# Patient Record
Sex: Female | Born: 1980
Health system: Southern US, Community
[De-identification: ages and names within clinical notes are randomized; demographics above are authoritative.]

## PROBLEM LIST (undated history)

## (undated) DIAGNOSIS — J301 Allergic rhinitis due to pollen: Secondary | ICD-10-CM

## (undated) DIAGNOSIS — I10 Essential (primary) hypertension: Secondary | ICD-10-CM

## (undated) DIAGNOSIS — N979 Female infertility, unspecified: Secondary | ICD-10-CM

## (undated) HISTORY — DX: Female infertility, unspecified: N97.9

## (undated) HISTORY — DX: Allergic rhinitis due to pollen: J30.1

## (undated) HISTORY — DX: Essential (primary) hypertension: I10

---

## 2002-02-02 ENCOUNTER — Encounter: Payer: Self-pay | Admitting: *Deleted

## 2002-02-02 ENCOUNTER — Inpatient Hospital Stay (HOSPITAL_COMMUNITY): Admission: AD | Admit: 2002-02-02 | Discharge: 2002-02-26 | Payer: Self-pay | Admitting: *Deleted

## 2002-02-08 ENCOUNTER — Encounter: Payer: Self-pay | Admitting: Family Medicine

## 2002-02-12 ENCOUNTER — Encounter: Payer: Self-pay | Admitting: *Deleted

## 2002-02-13 ENCOUNTER — Encounter: Payer: Self-pay | Admitting: *Deleted

## 2002-02-14 ENCOUNTER — Encounter: Payer: Self-pay | Admitting: Obstetrics and Gynecology

## 2002-02-16 ENCOUNTER — Encounter: Payer: Self-pay | Admitting: *Deleted

## 2002-02-17 ENCOUNTER — Encounter: Payer: Self-pay | Admitting: *Deleted

## 2002-02-18 ENCOUNTER — Encounter: Payer: Self-pay | Admitting: *Deleted

## 2002-02-19 ENCOUNTER — Encounter: Payer: Self-pay | Admitting: *Deleted

## 2002-02-27 ENCOUNTER — Inpatient Hospital Stay (HOSPITAL_COMMUNITY): Admission: AD | Admit: 2002-02-27 | Discharge: 2002-02-27 | Payer: Self-pay | Admitting: *Deleted

## 2004-05-14 ENCOUNTER — Ambulatory Visit: Payer: Self-pay

## 2011-06-30 ENCOUNTER — Emergency Department: Payer: Self-pay | Admitting: Emergency Medicine

## 2011-07-01 LAB — PREGNANCY, URINE: Pregnancy Test, Urine: NEGATIVE m[IU]/mL

## 2012-07-15 ENCOUNTER — Ambulatory Visit: Payer: Self-pay | Admitting: Internal Medicine

## 2013-03-11 DIAGNOSIS — N979 Female infertility, unspecified: Secondary | ICD-10-CM

## 2013-03-11 HISTORY — DX: Female infertility, unspecified: N97.9

## 2013-03-26 ENCOUNTER — Ambulatory Visit: Payer: Self-pay | Admitting: Obstetrics and Gynecology

## 2013-08-27 ENCOUNTER — Ambulatory Visit (INDEPENDENT_AMBULATORY_CARE_PROVIDER_SITE_OTHER): Payer: 59 | Admitting: Gynecology

## 2013-08-27 ENCOUNTER — Encounter: Payer: Self-pay | Admitting: Gynecology

## 2013-08-27 VITALS — BP 134/90 | Ht 62.0 in | Wt 227.0 lb

## 2013-08-27 DIAGNOSIS — N979 Female infertility, unspecified: Secondary | ICD-10-CM

## 2013-08-27 NOTE — Patient Instructions (Signed)
Semen Analysis This is a test used to learn about the health of your reproductive organs, particularly if your partner is having trouble becoming pregnant, or after a vasectomy to determine if the operation was successful. Semen is the turbid, whitish substance that is released from the penis during ejaculation. Sperm are the cells in semen with a head and a tail that enables them to travel to the egg. A sperm contains one copy of each chromosome (all of the female's genes) and fuses with the female's egg, resulting in fertilization.  PREPARATION FOR TEST A semen sample will be collected in a sterile, wide-mouth container provided by the lab. NORMAL FINDINGS  Volume: 2-5 mL  Liquefaction time: 20 to 30 minutes after collection  pH: 7.12-8.00  Sperm count (density): 50-200 million/mL  Sperm motility: 60% to 80% actively motile  Sperm morphology: 70% to 90% normally shaped Ranges for normal findings may vary among different laboratories and hospitals. You should always check with your doctor after having lab work or other tests done to discuss the meaning of your test results and whether your values are considered within normal limits. MEANING OF TEST  Your caregiver will go over the test results with you and discuss the importance and meaning of your results, as well as treatment options and the need for additional tests if necessary. OBTAINING THE TEST RESULTS It is your responsibility to obtain your test results. Ask the lab or department performing the test when and how you will get your results. Document Released: 03/22/2004 Document Revised: 05/20/2011 Document Reviewed: 02/07/2008 [facility] Patient Information 2015 [place], [place]. This information is not intended to replace advice given to you by your health care provider. Make sure you discuss any questions you have with your health care provider.

## 2013-08-27 NOTE — Progress Notes (Signed)
   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 This is patient's second marriage. She has never had children with her current partner. She was being followed by another gynecologist in [facility]. She had an HSG in January 2015 which was reported to be normal. Patient also has had normal ultrasound as well. She is reporting normal 28 day menstrual cycles. She is slightly overweight. Her husband has been adamant on getting a semen analysis. Her first pregnancy was by prior marriage spontaneous but delivered at 62 weeks secondary to preeclampsia. Patient many years ago was treated for an STD. Patient's recent annual exam and Pap smear in [facility] was poor to be normal.  We had a discussion of the importance of obtaining a semen analysis from her husband before moving forward. She has not been using the ovulation predictor kit that time or intercourse. She does state that her husband has very small testicles and has at times issues with direction and very little ejaculatory. She states that he has never had any issues such as trauma to the testicles are any developmental issues in his history. She does smoke a pack cigarette per day and is taking no other medications.  I have explained to the patient before we can move forward that he needs to see the urologist so that they may proceed with doing a semen analysis and further evaluation since she appears to be ovulating and has tubal patency. Meanwhile she was given instructions on the utilization of ovulation predictor kit that time or intercourse as well as the importance of prenatal vitamins for neural tube defect prophylaxis.  The patient did mention that this year her Pap smear was normal but HPV changes were noted and she will need a followup Pap smear in one year.

## 2014-01-10 ENCOUNTER — Encounter: Payer: Self-pay | Admitting: Gynecology

## 2014-07-05 ENCOUNTER — Ambulatory Visit
Admit: 2014-07-05 | Disposition: A | Discharge status: Home / Self Care | Payer: Self-pay | Attending: Family Medicine | Admitting: Family Medicine

## 2014-08-25 DIAGNOSIS — R0789 Other chest pain: Secondary | ICD-10-CM | POA: Insufficient documentation

## 2016-07-24 ENCOUNTER — Encounter: Payer: Self-pay | Admitting: Gynecology

## 2017-04-27 ENCOUNTER — Encounter: Payer: Self-pay | Admitting: Emergency Medicine

## 2017-04-27 ENCOUNTER — Other Ambulatory Visit: Payer: Self-pay

## 2017-04-27 ENCOUNTER — Emergency Department: Payer: 59

## 2017-04-27 ENCOUNTER — Emergency Department
Admission: EM | Admit: 2017-04-27 | Discharge: 2017-04-27 | Disposition: A | Discharge status: Home / Self Care | Payer: 59 | Attending: Emergency Medicine | Admitting: Emergency Medicine

## 2017-04-27 DIAGNOSIS — R102 Pelvic and perineal pain: Secondary | ICD-10-CM

## 2017-04-27 DIAGNOSIS — D219 Benign neoplasm of connective and other soft tissue, unspecified: Secondary | ICD-10-CM

## 2017-04-27 DIAGNOSIS — R319 Hematuria, unspecified: Secondary | ICD-10-CM

## 2017-04-27 DIAGNOSIS — D250 Submucous leiomyoma of uterus: Secondary | ICD-10-CM | POA: Diagnosis not present

## 2017-04-27 DIAGNOSIS — D529 Folate deficiency anemia, unspecified: Secondary | ICD-10-CM | POA: Diagnosis not present

## 2017-04-27 LAB — URINALYSIS, COMPLETE (UACMP) WITH MICROSCOPIC
Bacteria, UA: NONE SEEN
Bilirubin Urine: NEGATIVE
GLUCOSE, UA: NEGATIVE mg/dL
Ketones, ur: NEGATIVE mg/dL
LEUKOCYTES UA: NEGATIVE
NITRITE: NEGATIVE
PROTEIN: 30 mg/dL — AB
Specific Gravity, Urine: 1.027 (ref 1.005–1.030)
pH: 5 (ref 5.0–8.0)

## 2017-04-27 LAB — WET PREP, GENITAL
CLUE CELLS WET PREP: NONE SEEN
SPERM: NONE SEEN
TRICH WET PREP: NONE SEEN
Yeast Wet Prep HPF POC: NONE SEEN

## 2017-04-27 LAB — CHLAMYDIA/NGC RT PCR (ARMC ONLY)
Chlamydia Tr: NOT DETECTED
N gonorrhoeae: NOT DETECTED

## 2017-04-27 LAB — PREGNANCY, URINE: Preg Test, Ur: NEGATIVE

## 2017-04-27 LAB — POCT PREGNANCY, URINE: Preg Test, Ur: NEGATIVE

## 2017-04-27 NOTE — Discharge Instructions (Addendum)
You are evaluated for left lower abdominal pain and also found to have some blood in the urine, uncertain etiology, although no certain cause was found, your exam and evaluation are overall reassuring in the emergency room today.  Return to emerge department immediately for any worsening or uncontrolled pain, black or bloody stool, vomiting, or any other symptoms concerning to you.

## 2017-04-27 NOTE — ED Notes (Signed)
Pt verbalizes d/c understanding and follow up. Pt in NAD at time of departure, VS stable. Pt ambulatory to lobby

## 2017-04-27 NOTE — ED Triage Notes (Signed)
Pt arrives ambulatory to triage with c/o of lower left pelvic pain. Pt reports hx of ovarian cyst and reports that this feels like the same. Pt is in NAD at this time.

## 2017-04-27 NOTE — ED Provider Notes (Signed)
[facility] Emergency Department Provider Note ____________________________________________   I have reviewed the triage vital signs and the triage nursing note.  HISTORY  Chief Complaint Pelvic Pain   Historian Patient  HPI Sherri Blanchard is a 37 y.o. female presents with left lower quadrant pain since Thursday.  It feels like prior ovarian cyst.  She states that she is admitted cycle right now.  She is not on her period.  Denies urinary symptoms.  States that she has occasional diarrhea, does not deal with constipation.  No fevers.  No nausea or vomiting.  Symptoms are moderate, nothing makes it worse or better.   Past Medical History:  Diagnosis Date  . Hypertension     Patient Active Problem List   Diagnosis Date Noted  . Infertility, female, secondary 08/27/2013    Past Surgical History:  Procedure Laterality Date  . CESAREAN SECTION  2003    Prior to Admission medications   Medication Sig Start Date End Date Taking? Authorizing Provider  cetirizine (ZYRTEC) 10 MG tablet Take 10 mg by mouth daily. 02/22/17  Yes [provider]  hydrochlorothiazide (HYDRODIURIL) 25 MG tablet Take 25 mg by mouth daily.    Yes [provider]  Multiple Vitamin (MULTIVITAMIN) tablet Take 1 tablet by mouth daily.   Yes [provider]    Allergies  Allergen Reactions  . Amoxicillin-Pot Clavulanate Other (See Comments)    Yeast infection     Family History  Problem Relation Age of Onset  . Hypertension Mother   . Diabetes Father   . Hypertension Father   . Cancer Father        PROSTATE  . Breast cancer Maternal Grandmother   . Cancer Maternal Grandmother        BONE  . Cancer Maternal Grandfather        LUNG  . Diabetes Paternal Grandmother     Social History Social History   Tobacco Use  . Smoking status: Current Some Day Smoker    Packs/day: 0.10  . Smokeless tobacco: Never Used  Substance Use Topics  . Alcohol  use: Yes    Comment: OCC  . Drug use: No    Review of Systems  Constitutional: Negative for fever. Eyes: Negative for visual changes. ENT: Negative for sore throat. Cardiovascular: Negative for chest pain. Respiratory: Negative for shortness of breath. Gastrointestinal: Negative for vomiting. Genitourinary: Negative for dysuria. Musculoskeletal: Negative for back pain. Skin: Negative for rash. Neurological: Negative for headache.  ____________________________________________   PHYSICAL EXAM:  VITAL SIGNS: ED Triage Vitals  Enc Vitals Group     BP 04/27/17 0217 (!) 151/92     Pulse Rate 04/27/17 0217 (!) 102     Resp 04/27/17 0217 16     Temp 04/27/17 0217 (!) 97.5 F (36.4 C)     Temp Source 04/27/17 0217 Oral     SpO2 04/27/17 0217 98 %     Weight 04/27/17 0222 240 lb (108.9 kg)     Height 04/27/17 0222 5\' 1"  (1.549 m)     Head Circumference --      Peak Flow --      Pain Score 04/27/17 0221 5     Pain Loc --      Pain Edu? --      Excl. in [place]? --      Constitutional: Alert and oriented. Well appearing and in no distress. HEENT   Head: Normocephalic and atraumatic.      Eyes: Conjunctivae  are normal. Pupils equal and round.       Ears:         Nose: No congestion/rhinnorhea.   Mouth/Throat: Mucous membranes are moist.   Neck: No stridor. Cardiovascular/Chest: Normal rate, regular rhythm.  No murmurs, rubs, or gallops. Respiratory: Normal respiratory effort without tachypnea nor retractions. Breath sounds are clear and equal bilaterally. No wheezes/rales/rhonchi. Gastrointestinal: Soft. No distention, no guarding, no rebound.  Beats.  Mild tenderness left lower quadrant. Genitourinary/rectal: Scant normal-appearing discharge without any cervicitis or adnexal tenderness or mass. Musculoskeletal: Nontender with normal range of motion in all extremities. No joint effusions.  No lower extremity tenderness.  No edema. Neurologic:  Normal speech and  language. No gross or focal neurologic deficits are appreciated. Skin:  Skin is warm, dry and intact. No rash noted. Psychiatric: Mood and affect are normal. Speech and behavior are normal. Patient exhibits appropriate insight and judgment.   ____________________________________________  LABS (pertinent positives/negatives) I, Lisa Roca, MD the attending physician have reviewed the labs noted below.  Labs Reviewed  WET PREP, GENITAL - Abnormal; Notable for the following components:      Result Value   WBC, Wet Prep HPF POC RARE (*)    All other components within normal limits  URINALYSIS, COMPLETE (UACMP) WITH MICROSCOPIC - Abnormal; Notable for the following components:   Color, Urine YELLOW (*)    APPearance HAZY (*)    Hgb urine dipstick MODERATE (*)    Protein, ur 30 (*)    Squamous Epithelial / LPF 0-5 (*)    All other components within normal limits  CHLAMYDIA/NGC RT PCR ([facility] ONLY)  PREGNANCY, URINE  POC URINE PREG, ED  POCT PREGNANCY, URINE    ____________________________________________  RADIOLOGY All Xrays were viewed by me.  Imaging interpreted by Radiologist, and I, Lisa Roca, MD the attending physician have reviewed the radiologist interpretation noted below.  Pelvic ultrasound and transvaginal:   IMPRESSION: 1. Normal blood flow to both ovaries. 2. Sub mucosal fibroid at the posterior uterine fundus measuring up to 1.1 cm. 3. Thickened and mildly vascular endometrium. Correlate with phase of menstrual cycle. This thickness would be normal for late proliferative or secretory phases.  CT abd without contrast:  IMPRESSION: No acute findings in the abdomen/pelvis. __________________________________________  PROCEDURES  Procedure(s) performed: None  Critical Care performed: None   ____________________________________________  ED COURSE / ASSESSMENT AND PLAN  Pertinent labs & imaging results that were available during my care of the  patient were reviewed by me and considered in my medical decision making (see chart for details).   No ovarian cyst or torsion on ultrasound.  Adding on pregnancy test.  Patient has hematuria without urinary symptoms.  We discussed possibility of kidney stone, chose to proceed with CT imaging given persistent moderate to severe pain without alternative cause at this point time.  We discussed risk and benefit with regard to radiation exposure and chose to proceed.  She herself is a little concerned about the diagnosis of diverticulitis, however she has not had a fever or bloody stool and she has normal white blood cell count.  Wet prep and gonorrhea and chlamydia are all reassuring.  CT scan shows no acute findings.  Discussed with patient, okay for discharge.  Recommend primary care follow-up.  DIFFERENTIAL DIAGNOSIS: Differential diagnosis includes, but is not limited to, ovarian cyst, ovarian torsion, acute appendicitis, diverticulitis, urinary tract infection/pyelonephritis, endometriosis, bowel obstruction, colitis, renal colic, gastroenteritis, hernia, fibroids, endometriosis, pregnancy related pain including ectopic pregnancy, etc.  CONSULTATIONS:   None   Patient / Family / Caregiver informed of clinical course, medical decision-making process, and agree with plan.   I discussed return precautions, follow-up instructions, and discharge instructions with patient and/or family.  Discharge Instructions : You are evaluated for left lower abdominal pain and also found to have some blood in the urine, uncertain etiology, although no certain cause was found, your exam and evaluation are overall reassuring in the emergency room today.  Return to emerge department immediately for any worsening or uncontrolled pain, black or bloody stool, vomiting, or any other symptoms concerning to you.    ___________________________________________   FINAL CLINICAL IMPRESSION(S) / ED  DIAGNOSES   Final diagnoses:  Pelvic pain in female  Hematuria of undiagnosed cause  Fibroid      ___________________________________________        Note: This dictation was prepared with Dragon dictation. Any transcriptional errors that result from this process are unintentional    Lisa Roca, MD 04/27/17 1222

## 2017-04-27 NOTE — ED Notes (Signed)
Pt to US via wheelchair.

## 2017-05-08 DIAGNOSIS — R102 Pelvic and perineal pain: Secondary | ICD-10-CM | POA: Diagnosis not present

## 2017-06-17 DIAGNOSIS — Z3200 Encounter for pregnancy test, result unknown: Secondary | ICD-10-CM | POA: Diagnosis not present

## 2017-07-28 DIAGNOSIS — M2142 Flat foot [pes planus] (acquired), left foot: Secondary | ICD-10-CM | POA: Diagnosis not present

## 2017-07-28 DIAGNOSIS — M79672 Pain in left foot: Secondary | ICD-10-CM | POA: Diagnosis not present

## 2017-07-28 DIAGNOSIS — Q666 Other congenital valgus deformities of feet: Secondary | ICD-10-CM | POA: Diagnosis not present

## 2017-07-28 DIAGNOSIS — M2141 Flat foot [pes planus] (acquired), right foot: Secondary | ICD-10-CM | POA: Diagnosis not present

## 2017-07-28 DIAGNOSIS — M2011 Hallux valgus (acquired), right foot: Secondary | ICD-10-CM | POA: Diagnosis not present

## 2017-07-28 DIAGNOSIS — M21619 Bunion of unspecified foot: Secondary | ICD-10-CM | POA: Diagnosis not present

## 2017-07-28 DIAGNOSIS — M79671 Pain in right foot: Secondary | ICD-10-CM | POA: Diagnosis not present

## 2017-09-30 ENCOUNTER — Encounter: Payer: Self-pay | Admitting: Obstetrics and Gynecology

## 2017-09-30 ENCOUNTER — Other Ambulatory Visit (HOSPITAL_COMMUNITY)
Admission: RE | Admit: 2017-09-30 | Discharge: 2017-09-30 | Disposition: A | Discharge status: Home / Self Care | Payer: 59 | Source: Ambulatory Visit | Attending: Obstetrics and Gynecology | Admitting: Obstetrics and Gynecology

## 2017-09-30 ENCOUNTER — Ambulatory Visit (INDEPENDENT_AMBULATORY_CARE_PROVIDER_SITE_OTHER): Payer: 59 | Admitting: Obstetrics and Gynecology

## 2017-09-30 VITALS — BP 149/85 | HR 88 | Ht 62.0 in | Wt 247.0 lb

## 2017-09-30 DIAGNOSIS — N3090 Cystitis, unspecified without hematuria: Secondary | ICD-10-CM

## 2017-09-30 DIAGNOSIS — Z01411 Encounter for gynecological examination (general) (routine) with abnormal findings: Secondary | ICD-10-CM

## 2017-09-30 DIAGNOSIS — Z0000 Encounter for general adult medical examination without abnormal findings: Secondary | ICD-10-CM | POA: Diagnosis present

## 2017-09-30 DIAGNOSIS — N644 Mastodynia: Secondary | ICD-10-CM | POA: Diagnosis not present

## 2017-09-30 DIAGNOSIS — Z803 Family history of malignant neoplasm of breast: Secondary | ICD-10-CM

## 2017-09-30 LAB — POCT URINALYSIS DIPSTICK
BILIRUBIN UA: NEGATIVE
GLUCOSE UA: NEGATIVE
Ketones, UA: NEGATIVE
Leukocytes, UA: NEGATIVE
Nitrite, UA: NEGATIVE
Protein, UA: NEGATIVE
Spec Grav, UA: 1.015 (ref 1.010–1.025)
Urobilinogen, UA: NEGATIVE E.U./dL — AB
pH, UA: 6 (ref 5.0–8.0)

## 2017-09-30 NOTE — Progress Notes (Signed)
HPI:      Sherri Blanchard is a 37 y.o. G1P1 who LMP was No LMP recorded.  Subjective:   She presents today for her annual examination.  She complains of some breast tenderness of her left breast in the lower inner quadrant.  She says it has been present for 3 days.  She cannot remember any trauma to this area.  She reports a family history of breast cancer-her grandmother died of this disease.  No other relatives that she knows that have had breast cancer. She has normal regular menstrual cycles and has undergone a partial work-up for infertility.  She has been under the impression and is convinced that is her partner causing the infertility issue.  He refuses any testing.  She has previously delivered a child who is now 37 years old. Patient was recently seen for an ultrasound for left-sided pain.  She says this pain is intermittent but wonders about the cyst that they found on her ultrasound.  After reviewing the ultrasound a small 1 cm fibroid is noted but no ovarian cysts are present.    Hx: The following portions of the patient's history were reviewed and updated as appropriate:             She  has a past medical history of Hypertension. She does not have any pertinent problems on file. She  has a past surgical history that includes Cesarean section (2003). Her family history includes Breast cancer in her maternal grandmother; Cancer in her father, maternal grandfather, and maternal grandmother; Diabetes in her father and paternal grandmother; Hypertension in her father and mother. She  reports that she has been smoking.  She has been smoking about 0.10 packs per day. She has never used smokeless tobacco. She reports that she drinks alcohol. She reports that she does not use drugs. She has a current medication list which includes the following prescription(s): cetirizine, hydrochlorothiazide, and multivitamin. She is allergic to amoxicillin-pot clavulanate.       Review of Systems:   Review of Systems  Constitutional: Denied constitutional symptoms, night sweats, recent illness, fatigue, fever, insomnia and weight loss.  Eyes: Denied eye symptoms, eye pain, photophobia, vision change and visual disturbance.  Ears/Nose/Throat/Neck: Denied ear, nose, throat or neck symptoms, hearing loss, nasal discharge, sinus congestion and sore throat.  Cardiovascular: Denied cardiovascular symptoms, arrhythmia, chest pain/pressure, edema, exercise intolerance, orthopnea and palpitations.  Respiratory: Denied pulmonary symptoms, asthma, pleuritic pain, productive sputum, cough, dyspnea and wheezing.  Gastrointestinal: Denied, gastro-esophageal reflux, melena, nausea and vomiting.  Genitourinary: Denied genitourinary symptoms including symptomatic vaginal discharge, pelvic relaxation issues, and urinary complaints.  Musculoskeletal: Denied musculoskeletal symptoms, stiffness, swelling, muscle weakness and myalgia.  Dermatologic: Denied dermatology symptoms, rash and scar.  Neurologic: Denied neurology symptoms, dizziness, headache, neck pain and syncope.  Psychiatric: Denied psychiatric symptoms, anxiety and depression.  Endocrine: Denied endocrine symptoms including hot flashes and night sweats.   Meds:   Current Outpatient Medications on File Prior to Visit  Medication Sig Dispense Refill  . cetirizine (ZYRTEC) 10 MG tablet Take 10 mg by mouth daily.  11  . hydrochlorothiazide (HYDRODIURIL) 25 MG tablet Take 25 mg by mouth daily.     . Multiple Vitamin (MULTIVITAMIN) tablet Take 1 tablet by mouth daily.     No current facility-administered medications on file prior to visit.     Objective:     Vitals:   09/30/17 1502  BP: (!) 149/85  Pulse: 88  Physical examination General NAD, Conversant  HEENT Atraumatic; Op clear with mmm.  Normo-cephalic. Pupils reactive. Anicteric sclerae  Thyroid/Neck Smooth without nodularity or enlargement. Normal ROM.  Neck Supple.   Skin No rashes, lesions or ulceration. Normal palpated skin turgor. No nodularity.  Breasts: No masses or discharge.  Symmetric.  No axillary adenopathy.  Lungs: Clear to auscultation.No rales or wheezes. Normal Respiratory effort, no retractions.  Heart: NSR.  No murmurs or rubs appreciated. No periferal edema  Abdomen: Soft.  Non-tender.  No masses.  No HSM. No hernia  Extremities: Moves all appropriately.  Normal ROM for age. No lymphadenopathy.  Neuro: Oriented to PPT.  Normal mood. Normal affect.     Pelvic:   Vulva: Normal appearance.  No lesions.  Vagina: No lesions or abnormalities noted.  Support: Normal pelvic support.  Urethra No masses tenderness or scarring.  Meatus Normal size without lesions or prolapse.  Cervix: Normal appearance.  No lesions.  Anus: Normal exam.  No lesions.  Perineum: Normal exam.  No lesions.        Bimanual   Uterus: Normal size.  Non-tender.  Mobile.  AV.  Adnexae: No masses.  Non-tender to palpation.  Cul-de-sac: Negative for abnormality.   Exam limited by patient body habitus   Assessment:    G1P1 Patient Active Problem List   Diagnosis Date Noted  . Infertility, female, secondary 08/27/2013     1. Annual physical exam   2. Family history of breast cancer   3. Bladder infection     Small amount of blood noted in the urine although patient asymptomatic.  Will send urine for culture and sensitivity.     Plan:            1.  Basic Screening Recommendations The basic screening recommendations for asymptomatic women were discussed with the patient during her visit.  The age-appropriate recommendations were discussed with her and the rational for the tests reviewed.  When I am informed by the patient that another primary care physician has previously obtained the age-appropriate tests and they are up-to-date, only outstanding tests are ordered and referrals given as necessary.  Abnormal results of tests will be discussed with her when  all of her results are completed. Pap and co-test  2.  Mammogram because of family history.  3.  We have discussed the possibility of genetic testing for inherited breast cancer and she will check with her insurance company.  4.  Patient was inquiring about the Gardasil shot and we have talked about this in detail she will check with her insurance company. 5.  No ovarian cyst noted on ultrasound and pain very irregular no further work-up necessary. Orders Orders Placed This Encounter  Procedures  . Urine Culture  . POCT urinalysis dipstick    No orders of the defined types were placed in this encounter.       F/U  Return in about 1 year (around 10/01/2018) for Annual Physical.  Finis Bud, M.D. 09/30/2017 4:00 PM

## 2017-09-30 NOTE — Progress Notes (Signed)
Pt found lump on inner left breast. Lump is sore with redness.

## 2017-10-01 ENCOUNTER — Telehealth: Payer: Self-pay | Admitting: Obstetrics and Gynecology

## 2017-10-01 NOTE — Telephone Encounter (Signed)
The patient called and stated her insurance does cover cancer/generic testing and the code given to her is 811.66 for the billing for that.  She is asking for the nurse or Dr. Amalia Hailey to contact her about moving forward.  Please advise, thanks.

## 2017-10-02 LAB — URINE CULTURE: Organism ID, Bacteria: NO GROWTH

## 2017-10-02 LAB — CYTOLOGY - PAP
DIAGNOSIS: NEGATIVE
HPV (WINDOPATH): NOT DETECTED

## 2017-10-07 ENCOUNTER — Encounter: Payer: Self-pay | Admitting: Obstetrics and Gynecology

## 2017-10-07 ENCOUNTER — Ambulatory Visit (INDEPENDENT_AMBULATORY_CARE_PROVIDER_SITE_OTHER): Payer: 59 | Admitting: Obstetrics and Gynecology

## 2017-10-07 VITALS — BP 136/83 | HR 96 | Ht 62.0 in | Wt 249.0 lb

## 2017-10-07 DIAGNOSIS — Z23 Encounter for immunization: Secondary | ICD-10-CM | POA: Diagnosis not present

## 2017-10-07 MED ORDER — HPV QUADRIVALENT VACCINE IM SUSP: 0.5000 mL | Freq: Once | INTRAMUSCULAR | 0 refills | Status: AC

## 2017-10-07 NOTE — Progress Notes (Signed)
PT presents today for first Gardisil injection, no side effects noted. Pt to follow up in 2 months for second Gardisil injection.  BP 136/83   Pulse 96   Wt 249 lb (112.9 kg)   BMI 45.54 kg/m

## 2017-10-21 ENCOUNTER — Encounter: Payer: Self-pay | Admitting: Obstetrics and Gynecology

## 2017-12-06 DIAGNOSIS — H66003 Acute suppurative otitis media without spontaneous rupture of ear drum, bilateral: Secondary | ICD-10-CM | POA: Diagnosis not present

## 2017-12-08 ENCOUNTER — Ambulatory Visit (INDEPENDENT_AMBULATORY_CARE_PROVIDER_SITE_OTHER): Payer: 59 | Admitting: Obstetrics and Gynecology

## 2017-12-08 VITALS — BP 131/84 | HR 76 | Ht 62.0 in | Wt 247.1 lb

## 2017-12-08 DIAGNOSIS — Z23 Encounter for immunization: Secondary | ICD-10-CM | POA: Diagnosis not present

## 2017-12-08 DIAGNOSIS — Z7185 Encounter for immunization safety counseling: Secondary | ICD-10-CM

## 2017-12-08 DIAGNOSIS — Z7189 Other specified counseling: Secondary | ICD-10-CM

## 2017-12-08 NOTE — Progress Notes (Signed)
Pt is present today to receive her 2nd HPV gardasil injection.  Pt stated that she is not having any side effect from the medication. Pt is schedule to return in 6 months for her final Gardasil injection.  BP 131/84   Pulse 76   Ht 5\' 2"  (1.575 m)   Wt 247 lb 1.6 oz (112.1 kg)   BMI 45.20 kg/m

## 2018-02-09 DIAGNOSIS — R05 Cough: Secondary | ICD-10-CM | POA: Diagnosis not present

## 2018-02-09 DIAGNOSIS — J019 Acute sinusitis, unspecified: Secondary | ICD-10-CM | POA: Diagnosis not present

## 2018-03-03 ENCOUNTER — Encounter: Payer: Self-pay | Admitting: Obstetrics and Gynecology

## 2018-03-03 ENCOUNTER — Ambulatory Visit (INDEPENDENT_AMBULATORY_CARE_PROVIDER_SITE_OTHER): Payer: 59 | Admitting: Obstetrics and Gynecology

## 2018-03-03 VITALS — BP 142/82 | HR 98 | Ht 61.5 in | Wt 248.0 lb

## 2018-03-03 DIAGNOSIS — N938 Other specified abnormal uterine and vaginal bleeding: Secondary | ICD-10-CM

## 2018-03-03 NOTE — Progress Notes (Signed)
HPI:      Sherri Blanchard is a 37 y.o. G1P1 who LMP was Patient's last menstrual period was 12/02/2017.  Subjective:   She presents today stating that she missed 2 menstrual periods in October and November and then began spotting for few weeks and yesterday had very heavy bleeding and cramping.  She reports that today it has become much lighter. She has decided that she is no longer attempting pregnancy and would like something for cycle control.    Hx: The following portions of the patient's history were reviewed and updated as appropriate:             She  has a past medical history of Hypertension. She does not have any pertinent problems on file. She  has a past surgical history that includes Cesarean section (2003). Her family history includes Breast cancer in her maternal grandmother; Cancer in her father, maternal grandfather, and maternal grandmother; Diabetes in her father and paternal grandmother; Hypertension in her father and mother. She  reports that she has been smoking. She has been smoking about 0.10 packs per day. She has never used smokeless tobacco. She reports current alcohol use. She reports that she does not use drugs. She has a current medication list which includes the following prescription(s): cetirizine, hydrochlorothiazide, and multivitamin. She is allergic to amoxicillin-pot clavulanate.       Review of Systems:  Review of Systems  Constitutional: Denied constitutional symptoms, night sweats, recent illness, fatigue, fever, insomnia and weight loss.  Eyes: Denied eye symptoms, eye pain, photophobia, vision change and visual disturbance.  Ears/Nose/Throat/Neck: Denied ear, nose, throat or neck symptoms, hearing loss, nasal discharge, sinus congestion and sore throat.  Cardiovascular: Denied cardiovascular symptoms, arrhythmia, chest pain/pressure, edema, exercise intolerance, orthopnea and palpitations.  Respiratory: Denied pulmonary symptoms, asthma, pleuritic  pain, productive sputum, cough, dyspnea and wheezing.  Gastrointestinal: Denied, gastro-esophageal reflux, melena, nausea and vomiting.  Genitourinary: See HPI for additional information.  Musculoskeletal: Denied musculoskeletal symptoms, stiffness, swelling, muscle weakness and myalgia.  Dermatologic: Denied dermatology symptoms, rash and scar.  Neurologic: Denied neurology symptoms, dizziness, headache, neck pain and syncope.  Psychiatric: Denied psychiatric symptoms, anxiety and depression.  Endocrine: Denied endocrine symptoms including hot flashes and night sweats.   Meds:   Current Outpatient Medications on File Prior to Visit  Medication Sig Dispense Refill  . cetirizine (ZYRTEC) 10 MG tablet Take 10 mg by mouth daily.  11  . hydrochlorothiazide (HYDRODIURIL) 25 MG tablet Take 25 mg by mouth daily.     . Multiple Vitamin (MULTIVITAMIN) tablet Take 1 tablet by mouth daily.     No current facility-administered medications on file prior to visit.     Objective:     Vitals:   03/03/18 0944  BP: (!) 142/82  Pulse: 98                Assessment:    G1P1 Patient Active Problem List   Diagnosis Date Noted  . Infertility, female, secondary 08/27/2013     1. Dysfunctional uterine bleeding     History of uterine fibroids  Patient has decided that she no longer desires fertility and would like something for cycle control.   Plan:            1.  IUD Literature on Mirena given.  Risks and benefits discussed.  She is considering IUD as an option for birth/cycle control. Patient has decided upon IUD for cycle control.  She will call us with her next  menses for scheduling. Orders No orders of the defined types were placed in this encounter.   No orders of the defined types were placed in this encounter.     F/U  Return for She is to call at the start of next menses. I spent 18 minutes involved in the care of this patient of which greater than 50% was spent discussing  cycle control, irregular bleeding with fibroids, risks and benefits of birth control pills and IUD.  All questions answered.  Finis Bud, M.D. 03/03/2018 10:25 AM

## 2018-05-12 ENCOUNTER — Ambulatory Visit (INDEPENDENT_AMBULATORY_CARE_PROVIDER_SITE_OTHER): Payer: 59 | Admitting: Obstetrics and Gynecology

## 2018-05-12 ENCOUNTER — Encounter: Payer: Self-pay | Admitting: Obstetrics and Gynecology

## 2018-05-12 VITALS — BP 138/81 | HR 85 | Ht 62.0 in | Wt 244.6 lb

## 2018-05-12 DIAGNOSIS — N898 Other specified noninflammatory disorders of vagina: Secondary | ICD-10-CM | POA: Diagnosis not present

## 2018-05-12 DIAGNOSIS — N938 Other specified abnormal uterine and vaginal bleeding: Secondary | ICD-10-CM

## 2018-05-12 NOTE — Addendum Note (Signed)
Addended by: Finis Bud on: 05/12/2018 02:00 PM   Modules accepted: Orders

## 2018-05-12 NOTE — Progress Notes (Signed)
Patient comes in today for vaginal itching and odor. Patient states that the itching is on vulva. She does not know when her last LMP is. Her cycles are very irregular. She states that she will bleed for a couple weeks then be off for a week.

## 2018-05-12 NOTE — Progress Notes (Signed)
HPI:      Sherri Blanchard is a 38 y.o. G1P1 who LMP was No LMP recorded (lmp unknown).  Subjective:   She presents today with complaint of 1 week history of vaginal odor.  She has been bleeding intermittently but almost every day.  She also complains of some vulvar irritation. She has decided that she would like to have another child and has had infertility problems.  Desires referral to infertility specialist.    Hx: The following portions of the patient's history were reviewed and updated as appropriate:             She  has a past medical history of Hypertension. She does not have any pertinent problems on file. She  has a past surgical history that includes Cesarean section (2003). Her family history includes Breast cancer in her maternal grandmother; Cancer in her father, maternal grandfather, and maternal grandmother; Diabetes in her father and paternal grandmother; Hypertension in her father and mother. She  reports that she has been smoking. She has been smoking about 0.10 packs per day. She has never used smokeless tobacco. She reports current alcohol use. She reports that she does not use drugs. She has a current medication list which includes the following prescription(s): cetirizine, hydrochlorothiazide, and multivitamin. She is allergic to amoxicillin-pot clavulanate.       Review of Systems:  Review of Systems  Constitutional: Denied constitutional symptoms, night sweats, recent illness, fatigue, fever, insomnia and weight loss.  Eyes: Denied eye symptoms, eye pain, photophobia, vision change and visual disturbance.  Ears/Nose/Throat/Neck: Denied ear, nose, throat or neck symptoms, hearing loss, nasal discharge, sinus congestion and sore throat.  Cardiovascular: Denied cardiovascular symptoms, arrhythmia, chest pain/pressure, edema, exercise intolerance, orthopnea and palpitations.  Respiratory: Denied pulmonary symptoms, asthma, pleuritic pain, productive sputum, cough,  dyspnea and wheezing.  Gastrointestinal: Denied, gastro-esophageal reflux, melena, nausea and vomiting.  Genitourinary: See HPI for additional information.  Musculoskeletal: Denied musculoskeletal symptoms, stiffness, swelling, muscle weakness and myalgia.  Dermatologic: Denied dermatology symptoms, rash and scar.  Neurologic: Denied neurology symptoms, dizziness, headache, neck pain and syncope.  Psychiatric: Denied psychiatric symptoms, anxiety and depression.  Endocrine: Denied endocrine symptoms including hot flashes and night sweats.   Meds:   Current Outpatient Medications on File Prior to Visit  Medication Sig Dispense Refill  . cetirizine (ZYRTEC) 10 MG tablet Take 10 mg by mouth daily.  11  . hydrochlorothiazide (HYDRODIURIL) 25 MG tablet Take 25 mg by mouth daily.     . Multiple Vitamin (MULTIVITAMIN) tablet Take 1 tablet by mouth daily.     No current facility-administered medications on file prior to visit.     Objective:     Vitals:   05/12/18 0845  BP: 138/81  Pulse: 85              Physical examination   Pelvic:   Vulva: Normal appearance.  No lesions.  No obvious yeast or rash lesions.  Vagina: No lesions or abnormalities noted.  Vaginal bleeding small amount  Support: Normal pelvic support.  Urethra No masses tenderness or scarring.  Meatus Normal size without lesions or prolapse.  Cervix: Normal appearance.  No lesions.  Anus: Normal exam.  No lesions.  Perineum: Normal exam.  No lesions.        Bimanual   Uterus: Normal size.  Non-tender.  Mobile.  AV.  Adnexae: No masses.  Non-tender to palpation.  Cul-de-sac: Negative for abnormality.   WET PREP: clue cells: absent, KOH (yeast):  negative, odor: absent and trichomoniasis: negative Ph:  < 4.5   Assessment:    G1P1 Patient Active Problem List   Diagnosis Date Noted  . Infertility, female, secondary 08/27/2013     1. Dysfunctional uterine bleeding   2. Vaginal odor     Odor possibly  secondary to continue vaginal bleeding.  No evidence of vaginal infection   Plan:            1.  Reassured patient.  [facility] for semen analysis  3.  Recommend Dr. Kerin Perna for infertility as patient is near end of her reproductive age. Orders No orders of the defined types were placed in this encounter.   No orders of the defined types were placed in this encounter.     F/U  No follow-ups on file. I spent 18 minutes involved in the care of this patient of which greater than 50% was spent discussing vaginal odor, wet prep findings, infertility issues, dysfunctional bleeding.  All questions answered.  Finis Bud, M.D. 05/12/2018 9:46 AM

## 2018-06-05 ENCOUNTER — Telehealth: Payer: Self-pay

## 2018-06-05 NOTE — Telephone Encounter (Signed)
LMTRC

## 2018-06-08 ENCOUNTER — Other Ambulatory Visit: Payer: Self-pay

## 2018-06-08 ENCOUNTER — Ambulatory Visit (INDEPENDENT_AMBULATORY_CARE_PROVIDER_SITE_OTHER): Payer: 59 | Admitting: Obstetrics and Gynecology

## 2018-06-08 VITALS — BP 139/82 | HR 79 | Ht 62.0 in | Wt 248.1 lb

## 2018-06-08 DIAGNOSIS — Z23 Encounter for immunization: Secondary | ICD-10-CM | POA: Diagnosis not present

## 2018-06-08 NOTE — Progress Notes (Signed)
Patient comes in today for her last HPV injection. Injection given in left arm. Patient tolerated well.

## 2018-06-23 ENCOUNTER — Telehealth: Payer: Self-pay | Admitting: Internal Medicine

## 2018-06-23 NOTE — Telephone Encounter (Signed)
I left a detailed message on pt's v.m. to r/s appointment to a virtual office visit or r/s appointment.

## 2018-06-30 ENCOUNTER — Ambulatory Visit (INDEPENDENT_AMBULATORY_CARE_PROVIDER_SITE_OTHER): Payer: 59 | Admitting: Internal Medicine

## 2018-06-30 ENCOUNTER — Encounter: Payer: Self-pay | Admitting: Internal Medicine

## 2018-06-30 VITALS — BP 138/87 | Temp 98.7°F | Ht 62.0 in | Wt 248.0 lb

## 2018-06-30 DIAGNOSIS — I10 Essential (primary) hypertension: Secondary | ICD-10-CM

## 2018-06-30 DIAGNOSIS — K589 Irritable bowel syndrome without diarrhea: Secondary | ICD-10-CM | POA: Diagnosis not present

## 2018-06-30 DIAGNOSIS — J301 Allergic rhinitis due to pollen: Secondary | ICD-10-CM | POA: Insufficient documentation

## 2018-06-30 MED ORDER — HYDROCHLOROTHIAZIDE 25 MG PO TABS: 25.0000 mg | ORAL_TABLET | Freq: Every day | ORAL | 3 refills | Status: DC

## 2018-06-30 NOTE — Assessment & Plan Note (Signed)
BP Readings from Last 3 Encounters:  06/30/18 138/87  06/08/18 139/82  05/12/18 138/81   Acceptable control Will continue the medication Will check labs at [facility] later this year Discussed cigarette cessation,regular exercise

## 2018-06-30 NOTE — Progress Notes (Signed)
Subjective:    Patient ID: Sherri Blanchard, female    DOB: 01-19-1981, 38 y.o.   MRN: [id number]  HPI Virtual visit to establish care Identification done Discussed billing and she gave consent She is in her home--I am in my office  Has had intermittent dysfunctional uterine bleeding Some pain--especially with intercourse Having trouble with fertility and so is considering seeing specialist No regular periods  Is on medication for HTN Due for refills on her medicine now She monitors this---last 138/87 Can be as high as 150/89 if stressed  Current Outpatient Medications on File Prior to Visit  Medication Sig Dispense Refill  . cetirizine (ZYRTEC) 10 MG tablet Take 10 mg by mouth daily.  11  . hydrochlorothiazide (HYDRODIURIL) 25 MG tablet Take 25 mg by mouth daily.     . Multiple Vitamin (MULTIVITAMIN) tablet Take 1 tablet by mouth daily.     No current facility-administered medications on file prior to visit.     Allergies  Allergen Reactions  . Amoxicillin-Pot Clavulanate Other (See Comments)    Yeast infection  Yeast infection per pt    Past Medical History:  Diagnosis Date  . Hypertension   . Infertility, female 22    Past Surgical History:  Procedure Laterality Date  . CESAREAN SECTION  2003    Family History  Problem Relation Age of Onset  . Hypertension Mother   . Heart disease Mother        had MI  . Heart attack Mother 72       April 2019  . Diabetes Father   . Hypertension Father   . Cancer Father        PROSTATE  . Hyperthyroidism Father 32       just DX April 2020  . Breast cancer Maternal Grandmother   . Cancer Maternal Grandmother        BONE  . Cancer Maternal Grandfather        LUNG  . Diabetes Paternal Grandmother     Social History   Socioeconomic History  . Marital status: Single    Spouse name: Not on file  . Number of children: 1  . Years of education: Not on file  . Highest education level: Not on file  Occupational  History  . Occupation: Ship broker: [employer]  . Financial resource strain: Not hard at all  . Food insecurity:    Worry: Never true    Inability: Never true  . Transportation needs:    Medical: No    Non-medical: No  Tobacco Use  . Smoking status: Current Some Day Smoker    Packs/day: 0.10  . Smokeless tobacco: Never Used  Substance and Sexual Activity  . Alcohol use: Yes    Comment: OCC  . Drug use: No  . Sexual activity: Yes  Lifestyle  . Physical activity:    Days per week: Not on file    Minutes per session: Not on file  . Stress: Not on file  Relationships  . Social connections:    Talks on phone: Not on file    Gets together: Not on file    Attends religious service: Not on file    Active member of club or organization: Not on file    Attends meetings of clubs or organizations: Not on file    Relationship status: Not on file  . Intimate partner violence:    Fear of current or ex partner:  Not on file    Emotionally abused: Not on file    Physically abused: Not on file    Forced sexual activity: Not on file  Other Topics Concern  . Not on file  Social History Narrative   With stable partner since 2011   Review of Systems  Constitutional: Negative for fatigue and unexpected weight change.       Smokes a few cigarettes---discussed Some exercise  HENT: Positive for tinnitus. Negative for hearing loss.        Recent root canal  Eyes: Positive for visual disturbance.       No diplopia or unilateral vision loss occ blurring if BP high  Respiratory: Negative for cough, chest tightness and shortness of breath.   Cardiovascular: Negative for chest pain, palpitations and leg swelling.  Gastrointestinal: Negative for abdominal pain, blood in stool and constipation.       Moves her bowels 6 times a day--no pain Some bloating  Endocrine: Negative for polydipsia and polyuria.  Genitourinary: Positive for frequency. Negative for  dyspareunia, dysuria and hematuria.  Musculoskeletal: Positive for back pain. Negative for joint swelling.       Some chest chondritis---"like a bubble"  Skin:       Some rash--better with the allergy medication  Allergic/Immunologic: Positive for environmental allergies. Negative for immunocompromised state.       Cetirizine is effective  Neurological: Negative for dizziness, syncope and light-headedness.       Some vertigo ---meclizine helps Occasional headaches with elevated BP  Psychiatric/Behavioral: Negative for dysphoric mood.       Mild insomnia---melatonin helps some (5mg ) Occasional anxiety       Objective:   Physical Exam  Constitutional: She appears well-developed. No distress.  Respiratory: Effort normal. No respiratory distress.  Psychiatric: She has a normal mood and affect. Her behavior is normal.           Assessment & Plan:

## 2018-06-30 NOTE — Assessment & Plan Note (Signed)
Goes frequently but without sig pain No signs of IBD Discussed occasional imodium Will review in more depth at next visit

## 2018-08-12 ENCOUNTER — Telehealth: Payer: Self-pay

## 2018-08-12 NOTE — Telephone Encounter (Signed)
Okay--that is a traditional medication used during pregnancy Please send Rx for 250mg  bid (#60 x 5) They can monitor her BP at visits there but she should probably come in here in 1-2 months for a follow up Take the HCTZ off her list

## 2018-08-12 NOTE — Telephone Encounter (Signed)
Spoke to pt. She saw the fertility dr yesterday. They suggested she take methydopa. Told her to call her PCP

## 2018-08-12 NOTE — Telephone Encounter (Signed)
There are questions about the safety of your hydrochlorothiazide in pregnancy---but some controversy about whether it should be changed to something else. Check with your fertility doctors to see if they recommend something else (a common choice is labetalol)

## 2018-08-12 NOTE — Telephone Encounter (Signed)
Copied from [place] [phone]. Topic: General - Inquiry >> Aug 11, 2018  4:28 PM Mathis Bud wrote: Reason for CRM: patient is going through ferility treatment and wants to know if medication hydrochlorothiazide (HYDRODIURIL) 25 MG  is safe and wants to know what other medications she can be own incase she gets pregnant  Call back # [phone]

## 2018-08-13 MED ORDER — METHYLDOPA 250 MG PO TABS: 250.0000 mg | ORAL_TABLET | Freq: Two times a day (BID) | ORAL | 5 refills | Status: DC

## 2018-08-13 NOTE — Telephone Encounter (Signed)
Spoke to pt. She will call back to schedule appt. rx sent to pharmacy. HCTZ taken off her lst.

## 2018-08-26 ENCOUNTER — Other Ambulatory Visit: Payer: Self-pay | Admitting: Obstetrics and Gynecology

## 2018-08-26 DIAGNOSIS — Z1231 Encounter for screening mammogram for malignant neoplasm of breast: Secondary | ICD-10-CM

## 2018-10-02 ENCOUNTER — Encounter: Payer: 59 | Admitting: Obstetrics and Gynecology

## 2018-10-08 ENCOUNTER — Other Ambulatory Visit: Payer: Self-pay

## 2018-10-08 ENCOUNTER — Ambulatory Visit (INDEPENDENT_AMBULATORY_CARE_PROVIDER_SITE_OTHER): Payer: 59 | Admitting: Obstetrics and Gynecology

## 2018-10-08 ENCOUNTER — Encounter: Payer: Self-pay | Admitting: Obstetrics and Gynecology

## 2018-10-08 VITALS — BP 141/88 | HR 90 | Ht 62.0 in | Wt 247.0 lb

## 2018-10-08 DIAGNOSIS — Z01419 Encounter for gynecological examination (general) (routine) without abnormal findings: Secondary | ICD-10-CM | POA: Diagnosis not present

## 2018-10-08 NOTE — Progress Notes (Signed)
HPI:      [name] is a 38 y.o. G1P1 who LMP was Patient's last menstrual period was 09/21/2018.  Subjective:   She presents today for her annual examination.  She states that she had several visits with infertility doctor Kerin Perna and tried for 1 cycle to become pregnant.  She did not.  She says her partner is no longer interested in so she has stopped attempting pregnancy.  She states that her cycles are now become regular.  She has no complaints today.    Hx: The following portions of the patient's history were reviewed and updated as appropriate:             She  has a past medical history of Allergic rhinitis due to pollen, Hypertension, and Infertility, female (2015). She does not have any pertinent problems on file. She  has a past surgical history that includes Cesarean section (2003). Her family history includes Breast cancer in her maternal grandmother; Cancer in her father, maternal grandfather, and maternal grandmother; Diabetes in her father and paternal grandmother; Heart attack (age of onset: 18) in her mother; Heart disease in her mother; Hypertension in her father and mother; Hyperthyroidism (age of onset: 37) in her father. She  reports that she has been smoking. She has been smoking about 0.10 packs per day. She has never used smokeless tobacco. She reports current alcohol use. She reports that she does not use drugs. She has a current medication list which includes the following prescription(s): cetirizine, hydrochlorothiazide, multivitamin, and methyldopa. She is allergic to amoxicillin-pot clavulanate.       Review of Systems:  Review of Systems  Constitutional: Denied constitutional symptoms, night sweats, recent illness, fatigue, fever, insomnia and weight loss.  Eyes: Denied eye symptoms, eye pain, photophobia, vision change and visual disturbance.  Ears/Nose/Throat/Neck: Denied ear, nose, throat or neck symptoms, hearing loss, nasal discharge, sinus  congestion and sore throat.  Cardiovascular: Denied cardiovascular symptoms, arrhythmia, chest pain/pressure, edema, exercise intolerance, orthopnea and palpitations.  Respiratory: Denied pulmonary symptoms, asthma, pleuritic pain, productive sputum, cough, dyspnea and wheezing.  Gastrointestinal: Denied, gastro-esophageal reflux, melena, nausea and vomiting.  Genitourinary: Denied genitourinary symptoms including symptomatic vaginal discharge, pelvic relaxation issues, and urinary complaints.  Musculoskeletal: Denied musculoskeletal symptoms, stiffness, swelling, muscle weakness and myalgia.  Dermatologic: Denied dermatology symptoms, rash and scar.  Neurologic: Denied neurology symptoms, dizziness, headache, neck pain and syncope.  Psychiatric: Denied psychiatric symptoms, anxiety and depression.  Endocrine: Denied endocrine symptoms including hot flashes and night sweats.   Meds:   Current Outpatient Medications on File Prior to Visit  Medication Sig Dispense Refill  . cetirizine (ZYRTEC) 10 MG tablet Take 10 mg by mouth daily.  11  . hydrochlorothiazide (HYDRODIURIL) 25 MG tablet     . Multiple Vitamin (MULTIVITAMIN) tablet Take 1 tablet by mouth daily.    . methyldopa (ALDOMET) 250 MG tablet Take 1 tablet (250 mg total) by mouth 2 (two) times daily. (Patient not taking: Reported on 10/08/2018) 60 tablet 5   No current facility-administered medications on file prior to visit.     Objective:     Vitals:   10/08/18 0836  BP: (!) 141/88  Pulse: 90              Physical examination General NAD, Conversant  HEENT Atraumatic; Op clear with mmm.  Normo-cephalic. Pupils reactive. Anicteric sclerae  Thyroid/Neck Smooth without nodularity or enlargement. Normal ROM.  Neck Supple.  Skin No rashes, lesions or ulceration. Normal palpated  skin turgor. No nodularity.  Breasts: No masses or discharge.  Symmetric.  No axillary adenopathy.  Lungs: Clear to auscultation.No rales or wheezes.  Normal Respiratory effort, no retractions.  Heart: NSR.  No murmurs or rubs appreciated. No periferal edema  Abdomen: Soft.  Non-tender.  No masses.  No HSM. No hernia  Extremities: Moves all appropriately.  Normal ROM for age. No lymphadenopathy.  Neuro: Oriented to PPT.  Normal mood. Normal affect.     Pelvic:   Vulva: Normal appearance.  No lesions.  Vagina: No lesions or abnormalities noted.  Support: Normal pelvic support.  Urethra No masses tenderness or scarring.  Meatus Normal size without lesions or prolapse.  Cervix: Normal appearance.  No lesions.  Anus: Normal exam.  No lesions.  Perineum: Normal exam.  No lesions.        Bimanual   Uterus: Normal size.  Non-tender.  Mobile.  AV.  Adnexae: No masses.  Non-tender to palpation.  Cul-de-sac: Negative for abnormality.    Exam limited by patient body habitus  Assessment:    G1P1 Patient Active Problem List   Diagnosis Date Noted  . Essential hypertension, benign 06/30/2018  . Irritable bowel syndrome 06/30/2018  . Allergic rhinitis due to pollen   . Atypical chest pain 08/25/2014  . Female infertility 08/27/2013     1. Well woman exam with routine gynecological exam   2. Morbid obesity ([place])        Plan:            1.  Basic Screening Recommendations The basic screening recommendations for asymptomatic women were discussed with the patient during her visit.  The age-appropriate recommendations were discussed with her and the rational for the tests reviewed.  When I am informed by the patient that another primary care physician has previously obtained the age-appropriate tests and they are up-to-date, only outstanding tests are ordered and referrals given as necessary.  Abnormal results of tests will be discussed with her when all of her results are completed. Mammogram scheduled Orders No orders of the defined types were placed in this encounter.   No orders of the defined types were placed in this  encounter.       F/U  Return in about 1 year (around 10/08/2019) for Annual Physical.  Finis Bud, M.D. 10/08/2018 8:59 AM

## 2018-10-08 NOTE — Progress Notes (Signed)
Patient comes in today for annual physical. She has no concerns today. She is not due for labs or Pap. Mammogram is scheduled in August.

## 2018-10-16 ENCOUNTER — Encounter: Payer: Self-pay | Admitting: Internal Medicine

## 2018-10-16 ENCOUNTER — Other Ambulatory Visit: Payer: Self-pay

## 2018-10-16 ENCOUNTER — Ambulatory Visit: Payer: 59 | Admitting: Internal Medicine

## 2018-10-16 VITALS — BP 110/76 | HR 94 | Temp 98.6°F | Ht 62.0 in | Wt 248.0 lb

## 2018-10-16 DIAGNOSIS — I10 Essential (primary) hypertension: Secondary | ICD-10-CM

## 2018-10-16 DIAGNOSIS — N979 Female infertility, unspecified: Secondary | ICD-10-CM | POA: Diagnosis not present

## 2018-10-16 DIAGNOSIS — M7989 Other specified soft tissue disorders: Secondary | ICD-10-CM | POA: Diagnosis not present

## 2018-10-16 DIAGNOSIS — R760 Raised antibody titer: Secondary | ICD-10-CM | POA: Diagnosis not present

## 2018-10-16 LAB — RENAL FUNCTION PANEL
Albumin: 4.2 g/dL (ref 3.5–5.2)
BUN: 12 mg/dL (ref 6–23)
CO2: 30 mEq/L (ref 19–32)
Calcium: 9.9 mg/dL (ref 8.4–10.5)
Chloride: 100 mEq/L (ref 96–112)
Creatinine, Ser: 0.79 mg/dL (ref 0.40–1.20)
GFR: 80.61 mL/min (ref >60.00)
Glucose, Bld: 103 mg/dL — ABNORMAL HIGH (ref 70–99)
Phosphorus: 2.7 mg/dL (ref 2.3–4.6)
Potassium: 3.7 mEq/L (ref 3.5–5.1)
Sodium: 137 mEq/L (ref 135–145)

## 2018-10-16 LAB — CBC
HCT: 41.3 % (ref 36.0–46.0)
Hemoglobin: 13.6 g/dL (ref 12.0–15.0)
MCHC: 33.1 g/dL (ref 30.0–36.0)
MCV: 88.6 fl (ref 78.0–100.0)
Platelets: 332 10*3/uL (ref 150.0–400.0)
RBC: 4.66 Mil/uL (ref 3.87–5.11)
RDW: 12.8 % (ref 11.5–15.5)
WBC: 13.1 10*3/uL — ABNORMAL HIGH (ref 4.0–10.5)

## 2018-10-16 LAB — HEPATIC FUNCTION PANEL
ALT: 21 U/L (ref 0–35)
AST: 20 U/L (ref 0–37)
Albumin: 4.2 g/dL (ref 3.5–5.2)
Alkaline Phosphatase: 84 U/L (ref 39–117)
Bilirubin, Direct: 0 mg/dL (ref 0.0–0.3)
Total Bilirubin: 0.2 mg/dL (ref 0.2–1.2)
Total Protein: 7.8 g/dL (ref 6.0–8.3)

## 2018-10-16 MED ORDER — TRIAMCINOLONE ACETONIDE 0.1 % EX CREA: 1.0000 "application " | TOPICAL_CREAM | Freq: Two times a day (BID) | CUTANEOUS | 1 refills | Status: DC | PRN

## 2018-10-16 MED ORDER — LABETALOL HCL 200 MG PO TABS: 200.0000 mg | ORAL_TABLET | Freq: Two times a day (BID) | ORAL | 11 refills | Status: DC

## 2018-10-16 NOTE — Assessment & Plan Note (Signed)
BP Readings from Last 3 Encounters:  10/16/18 110/76  10/08/18 (!) 141/88  06/30/18 138/87   Trying to conceive Didn't do well with methyldopa Will try labetalol

## 2018-10-16 NOTE — Assessment & Plan Note (Signed)
Minimal  Nothing to suggest DVT Observation only for now

## 2018-10-16 NOTE — Progress Notes (Signed)
Subjective:    Patient ID: Sherri Blanchard, female    DOB: January 05, 1981, 38 y.o.   MRN: [id number]  HPI Here for initial in person visit and she has concerns  Saw Dr Amalia Hailey for gyn/Pap [facility] Majestic to fertility doctor--Dr Donzetta Matters. She diagnosed her with lupus (turns out is was just lupus anticoagulant) Now on low dose ASA They changed her to methyldopa for HTN But now back on HCTZ  No rash or photosensitivity No joint pain other than left knee--some swelling and pain No dysphagia No history of serositis (just costochondritis)  Current Outpatient Medications on File Prior to Visit  Medication Sig Dispense Refill  . aspirin 81 MG chewable tablet Chew 81 mg by mouth daily.    . cetirizine (ZYRTEC) 10 MG tablet Take 10 mg by mouth daily.  11  . hydrochlorothiazide (HYDRODIURIL) 25 MG tablet     . Multiple Vitamin (MULTIVITAMIN) tablet Take 1 tablet by mouth daily.     No current facility-administered medications on file prior to visit.     Allergies  Allergen Reactions  . Amoxicillin-Pot Clavulanate Other (See Comments)    Yeast infection  Yeast infection per pt    Past Medical History:  Diagnosis Date  . Allergic rhinitis due to pollen   . Hypertension   . Infertility, female 61    Past Surgical History:  Procedure Laterality Date  . CESAREAN SECTION  2003    Family History  Problem Relation Age of Onset  . Hypertension Mother   . Heart disease Mother        had MI  . Heart attack Mother 72       April 2019  . Diabetes Father   . Hypertension Father   . Cancer Father        PROSTATE  . Hyperthyroidism Father 55       just DX April 2020  . Breast cancer Maternal Grandmother   . Cancer Maternal Grandmother        BONE  . Cancer Maternal Grandfather        LUNG  . Diabetes Paternal Grandmother     Social History   Socioeconomic History  . Marital status: Single    Spouse name: Not on file  . Number of children: 1  . Years of education: Not on file  . Highest  education level: Not on file  Occupational History  . Occupation: Ship broker: [employer]  . Financial resource strain: Not hard at all  . Food insecurity    Worry: Never true    Inability: Never true  . Transportation needs    Medical: No    Non-medical: No  Tobacco Use  . Smoking status: Current Some Day Smoker    Packs/day: 0.10  . Smokeless tobacco: Never Used  Substance and Sexual Activity  . Alcohol use: Yes    Comment: OCC  . Drug use: No  . Sexual activity: Yes  Lifestyle  . Physical activity    Days per week: Not on file    Minutes per session: Not on file  . Stress: Not on file  Relationships  . Social Herbalist on phone: Not on file    Gets together: Not on file    Attends religious service: Not on file    Active member of club or organization: Not on file    Attends meetings of clubs or organizations: Not on file  Relationship status: Not on file  . Intimate partner violence    Fear of current or ex partner: Not on file    Emotionally abused: Not on file    Physically abused: Not on file    Forced sexual activity: Not on file  Other Topics Concern  . Not on file  Social History Narrative   With stable partner since 2011   Review of Systems Some swelling in right calf also Rough area on left heel at times--veins are prominent Feels her ears may be clogged Occasionally feels balance is off    Objective:   Physical Exam  Constitutional: She appears well-developed. No distress.  HENT:  TMs and canals are normal  Neck: No thyromegaly present.  Cardiovascular: Normal rate, regular rhythm and normal heart sounds. Exam reveals no gallop.  No murmur heard. Respiratory: Effort normal and breath sounds normal. No respiratory distress. She has no wheezes. She has no rales.  Musculoskeletal:        General: No edema.     Comments: No left knee swelling Mild asymmetry in calves (right 45cm, left 44cm)--no  tenderness or cords  Lymphadenopathy:    She has no cervical adenopathy.  Skin:  Slight dry palpable area on medial left heel--no clear rash/infection (will give TAC to try)  Psychiatric: She has a normal mood and affect. Her behavior is normal.           Assessment & Plan:

## 2018-10-16 NOTE — Assessment & Plan Note (Signed)
No thrombosis but may have had 1 miscarriage Now on the asa

## 2018-10-16 NOTE — Assessment & Plan Note (Signed)
Had 1 course of treatment without success Holding off on Rx for now

## 2018-10-19 ENCOUNTER — Ambulatory Visit
Admission: RE | Admit: 2018-10-19 | Discharge: 2018-10-19 | Disposition: A | Discharge status: Home / Self Care | Payer: 59 | Source: Ambulatory Visit | Attending: Obstetrics and Gynecology | Admitting: Obstetrics and Gynecology

## 2018-10-19 ENCOUNTER — Other Ambulatory Visit: Payer: Self-pay

## 2018-10-19 DIAGNOSIS — Z1231 Encounter for screening mammogram for malignant neoplasm of breast: Secondary | ICD-10-CM | POA: Diagnosis present

## 2018-11-06 ENCOUNTER — Encounter: Payer: Self-pay | Admitting: Obstetrics and Gynecology

## 2018-11-06 ENCOUNTER — Other Ambulatory Visit: Payer: Self-pay

## 2018-11-06 ENCOUNTER — Ambulatory Visit (INDEPENDENT_AMBULATORY_CARE_PROVIDER_SITE_OTHER): Payer: 59 | Admitting: Obstetrics and Gynecology

## 2018-11-06 VITALS — BP 142/82 | HR 80 | Ht 62.0 in | Wt 247.0 lb

## 2018-11-06 DIAGNOSIS — Z3202 Encounter for pregnancy test, result negative: Secondary | ICD-10-CM | POA: Diagnosis not present

## 2018-11-06 DIAGNOSIS — N926 Irregular menstruation, unspecified: Secondary | ICD-10-CM | POA: Diagnosis not present

## 2018-11-06 LAB — POCT URINALYSIS DIPSTICK
Bilirubin, UA: NEGATIVE
Glucose, UA: NEGATIVE
Ketones, UA: NEGATIVE
Leukocytes, UA: NEGATIVE
Nitrite, UA: NEGATIVE
Protein, UA: NEGATIVE
Spec Grav, UA: 1.01 (ref 1.010–1.025)
Urobilinogen, UA: 0.2 E.U./dL
pH, UA: 6.5 (ref 5.0–8.0)

## 2018-11-06 LAB — POCT URINE PREGNANCY: Preg Test, Ur: NEGATIVE

## 2018-11-06 NOTE — Progress Notes (Signed)
HPI:      Ms. Lachon Marinko is a 38 y.o. G1P1 who LMP was Patient's last menstrual period was 09/21/2018.  Subjective:   She presents today because she missed a menstrual period.  She is attempting pregnancy.  She did not do home pregnancy testing.  She states that she went to the infertility specialist and they gave her a shot to make her ovulate and it "did not work" and she never went back because of the expense.  Since that time several months ago she has had normal regular cycles.  Based on clinical signs and symptoms she states that she is ovulating each month.  She skipped her normal menses in August and thought maybe she was pregnant.    Hx: The following portions of the patient's history were reviewed and updated as appropriate:             She  has a past medical history of Allergic rhinitis due to pollen, Hypertension, and Infertility, female (2015). She does not have any pertinent problems on file. She  has a past surgical history that includes Cesarean section (2003). Her family history includes Breast cancer in her maternal grandmother; Cancer in her father, maternal grandfather, and maternal grandmother; Diabetes in her father and paternal grandmother; Heart attack (age of onset: 34) in her mother; Heart disease in her mother; Hypertension in her father and mother; Hyperthyroidism (age of onset: 34) in her father. She  reports that she has been smoking. She has been smoking about 0.10 packs per day. She has never used smokeless tobacco. She reports current alcohol use. She reports that she does not use drugs. She has a current medication list which includes the following prescription(s): aspirin, cetirizine, labetalol, multivitamin, and triamcinolone cream. She is allergic to amoxicillin-pot clavulanate.       Review of Systems:  Review of Systems  Constitutional: Denied constitutional symptoms, night sweats, recent illness, fatigue, fever, insomnia and weight loss.  Eyes: Denied  eye symptoms, eye pain, photophobia, vision change and visual disturbance.  Ears/Nose/Throat/Neck: Denied ear, nose, throat or neck symptoms, hearing loss, nasal discharge, sinus congestion and sore throat.  Cardiovascular: Denied cardiovascular symptoms, arrhythmia, chest pain/pressure, edema, exercise intolerance, orthopnea and palpitations.  Respiratory: Denied pulmonary symptoms, asthma, pleuritic pain, productive sputum, cough, dyspnea and wheezing.  Gastrointestinal: Denied, gastro-esophageal reflux, melena, nausea and vomiting.  Genitourinary: Denied genitourinary symptoms including symptomatic vaginal discharge, pelvic relaxation issues, and urinary complaints.  Musculoskeletal: Denied musculoskeletal symptoms, stiffness, swelling, muscle weakness and myalgia.  Dermatologic: Denied dermatology symptoms, rash and scar.  Neurologic: Denied neurology symptoms, dizziness, headache, neck pain and syncope.  Psychiatric: Denied psychiatric symptoms, anxiety and depression.  Endocrine: Denied endocrine symptoms including hot flashes and night sweats.   Meds:   Current Outpatient Medications on File Prior to Visit  Medication Sig Dispense Refill  . aspirin 81 MG chewable tablet Chew 81 mg by mouth daily.    . cetirizine (ZYRTEC) 10 MG tablet Take 10 mg by mouth daily.  11  . labetalol (NORMODYNE) 200 MG tablet Take 1 tablet (200 mg total) by mouth 2 (two) times daily. 60 tablet 11  . Multiple Vitamin (MULTIVITAMIN) tablet Take 1 tablet by mouth daily.    Marland Kitchen triamcinolone cream (KENALOG) 0.1 % Apply 1 application topically 2 (two) times daily as needed. 45 g 1   No current facility-administered medications on file prior to visit.     Objective:     Vitals:   11/06/18 0810  BP: Marland Kitchen)  142/82  Pulse: 80              Urinary pregnancy test-negative  UA -some hematuria but nothing else   Assessment:    G1P1 Patient Active Problem List   Diagnosis Date Noted  . Lupus anticoagulant  positive 10/16/2018  . Calf swelling 10/16/2018  . Essential hypertension, benign 06/30/2018  . Irritable bowel syndrome 06/30/2018  . Allergic rhinitis due to pollen   . Female infertility 08/27/2013     1. Missed period     Negative beta hCG (urine)  Some hematuria   Plan:            1.  Serum beta hCG  2.  Urine for culture Orders Orders Placed This Encounter  Procedures  . Pregnancy, urine  . POCT urine pregnancy  . POCT urinalysis dipstick    No orders of the defined types were placed in this encounter.     F/U  Return for We will contact her with any abnormal test results. I spent 17 minutes involved in the care of this patient of which greater than 50% was spent discussing negative pregnancy test, missed menses, catch up on REI visit, possible UTI.  All questions answered.  Finis Bud, M.D. 11/06/2018 8:25 AM

## 2018-11-06 NOTE — Progress Notes (Signed)
Patient comes in today for pregnancy confirmation. UPT was negative today.

## 2018-11-07 LAB — BETA HCG QUANT (REF LAB): hCG Quant: <1 m[IU]/mL

## 2019-01-20 ENCOUNTER — Encounter: Payer: Self-pay | Admitting: Internal Medicine

## 2019-01-20 ENCOUNTER — Other Ambulatory Visit: Payer: Self-pay

## 2019-01-20 ENCOUNTER — Ambulatory Visit (INDEPENDENT_AMBULATORY_CARE_PROVIDER_SITE_OTHER): Payer: 59 | Admitting: Internal Medicine

## 2019-01-20 VITALS — BP 136/88 | HR 74 | Temp 98.1°F | Ht 62.5 in | Wt 247.0 lb

## 2019-01-20 DIAGNOSIS — Z23 Encounter for immunization: Secondary | ICD-10-CM

## 2019-01-20 DIAGNOSIS — Z0000 Encounter for general adult medical examination without abnormal findings: Secondary | ICD-10-CM | POA: Insufficient documentation

## 2019-01-20 DIAGNOSIS — I10 Essential (primary) hypertension: Secondary | ICD-10-CM

## 2019-01-20 NOTE — Assessment & Plan Note (Signed)
BP Readings from Last 3 Encounters:  01/20/19 136/88  11/06/18 (!) 142/82  10/16/18 110/76   Doing okay on the labetalol Labs this year fine

## 2019-01-20 NOTE — Progress Notes (Signed)
Subjective:    Patient ID: Sherri Blanchard, female    DOB: [birth year], 38 y.o.   MRN: [id number]  HPI Here for physical  No problems with new BP med Occasionally checks it at work---- 120-130's/75-80's No headache other than occasional stress  Last normal period was July Then might bleed 1/2 day--and it stops Does have known PCOS--she thinks She can tell when she is ovulatiing though  Current Outpatient Medications on File Prior to Visit  Medication Sig Dispense Refill  . aspirin 81 MG chewable tablet Chew 81 mg by mouth daily.    . cetirizine (ZYRTEC) 10 MG tablet Take 10 mg by mouth daily.  11  . labetalol (NORMODYNE) 200 MG tablet Take 1 tablet (200 mg total) by mouth 2 (two) times daily. 60 tablet 11  . Multiple Vitamin (MULTIVITAMIN) tablet Take 1 tablet by mouth daily.    Marland Kitchen triamcinolone cream (KENALOG) 0.1 % Apply 1 application topically 2 (two) times daily as needed. 45 g 1   No current facility-administered medications on file prior to visit.     Allergies  Allergen Reactions  . Amoxicillin-Pot Clavulanate Other (See Comments)    Yeast infection  Yeast infection per pt    Past Medical History:  Diagnosis Date  . Allergic rhinitis due to pollen   . Hypertension   . Infertility, female 75    Past Surgical History:  Procedure Laterality Date  . CESAREAN SECTION  2003    Family History  Problem Relation Age of Onset  . Hypertension Mother   . Heart disease Mother        had MI  . Heart attack Mother 72       April 2019  . Diabetes Father   . Hypertension Father   . Cancer Father        PROSTATE  . Hyperthyroidism Father 41       just DX April 2020  . Breast cancer Maternal Grandmother   . Cancer Maternal Grandmother        BONE  . Cancer Maternal Grandfather        LUNG  . Diabetes Paternal Grandmother     Social History   Socioeconomic History  . Marital status: Single    Spouse name: Not on file  . Number of children: 1  . Years of  education: Not on file  . Highest education level: Not on file  Occupational History  . Occupation: Ship broker: [employer]  . Financial resource strain: Not hard at all  . Food insecurity    Worry: Never true    Inability: Never true  . Transportation needs    Medical: No    Non-medical: No  Tobacco Use  . Smoking status: Current Some Day Smoker    Packs/day: 0.10  . Smokeless tobacco: Never Used  Substance and Sexual Activity  . Alcohol use: Yes    Comment: OCC  . Drug use: No  . Sexual activity: Yes  Lifestyle  . Physical activity    Days per week: Not on file    Minutes per session: Not on file  . Stress: Not on file  Relationships  . Social Herbalist on phone: Not on file    Gets together: Not on file    Attends religious service: Not on file    Active member of club or organization: Not on file    Attends meetings of clubs or  organizations: Not on file    Relationship status: Not on file  . Intimate partner violence    Fear of current or ex partner: Not on file    Emotionally abused: Not on file    Physically abused: Not on file    Forced sexual activity: Not on file  Other Topics Concern  . Not on file  Social History Narrative   With stable partner since 2011   Review of Systems  Constitutional: Negative for unexpected weight change.       Still smoking ---only a few a day. Discussed Not exercising Wears seat belt Started new energy pack--"no energy" if she doesn't take them  HENT: Positive for tinnitus. Negative for dental problem, hearing loss and trouble swallowing.   Eyes: Negative for visual disturbance.       No diplopia or unilateral vision loss Some blurry vision at times  Respiratory: Negative for cough, chest tightness and shortness of breath.   Cardiovascular: Negative for chest pain, palpitations and leg swelling.  Gastrointestinal: Negative for blood in stool.       Lots of indigestion and  heartburn--no meds Diarrhea at times--uses imodium at times  Endocrine: Negative for polydipsia and polyuria.  Genitourinary: Negative for dyspareunia, dysuria and hematuria.  Musculoskeletal: Positive for back pain.       Occ knee and back pain---tylenol or aleve help  Skin: Negative for rash.       No suspicious lesions  Allergic/Immunologic: Positive for environmental allergies. Negative for immunocompromised state.       Satisfied with cetirizine Even gets hives at times  Neurological: Negative for dizziness, syncope and light-headedness.  Hematological: Negative for adenopathy. Bruises/bleeds easily.  Psychiatric/Behavioral: Negative for dysphoric mood.       Sleep is variable---uses melatonin prn Occasional anxiety       Objective:   Physical Exam  Constitutional: She is oriented to person, place, and time. She appears well-developed. No distress.  HENT:  Head: Normocephalic and atraumatic.  Right Ear: External ear normal.  Left Ear: External ear normal.  Mouth/Throat: Oropharynx is clear and moist. No oropharyngeal exudate.  Eyes: Pupils are equal, round, and reactive to light. Conjunctivae are normal.  Neck: Normal range of motion. No thyromegaly present.  Cardiovascular: Normal rate, regular rhythm, normal heart sounds and intact distal pulses. Exam reveals no gallop.  No murmur heard. Respiratory: Effort normal and breath sounds normal. No respiratory distress. She has no wheezes. She has no rales.  GI: Soft. There is no abdominal tenderness.  Musculoskeletal:        General: No tenderness or edema.  Lymphadenopathy:    She has no cervical adenopathy.  Neurological: She is alert and oriented to person, place, and time.  Skin: No rash noted. No erythema.  Psychiatric: She has a normal mood and affect. Her behavior is normal.           Assessment & Plan:

## 2019-01-20 NOTE — Assessment & Plan Note (Signed)
Healthy but some concerns Needs to give up the cigs Discussed healthy eating and exercise Flu vaccine today

## 2019-01-20 NOTE — Addendum Note (Signed)
Addended by: Pilar Grammes on: 01/20/2019 08:36 AM   Modules accepted: Orders

## 2019-02-25 ENCOUNTER — Telehealth: Payer: Self-pay | Admitting: Internal Medicine

## 2019-02-25 MED ORDER — NORETHINDRONE-ETH ESTRADIOL 1-35 MG-MCG PO TABS: 1.0000 | ORAL_TABLET | Freq: Every day | ORAL | 11 refills | Status: DC

## 2019-02-25 NOTE — Telephone Encounter (Signed)
Let her know I sent the prescription. I sent a moderate estrogen dose because usually higher doses needed with polycystic ovary syndrome. If she doesn't stop bleeding, may need even extra estrogen

## 2019-02-25 NOTE — Telephone Encounter (Signed)
Left detailed message on VM per DPR. 

## 2019-02-25 NOTE — Telephone Encounter (Signed)
Patient stated that it was discussed at her last visit about starting birth control.  Patient stated that she has been bleeding for 3 weeks now  Patient would like to know if you could send this into her pharmacy

## 2019-04-20 ENCOUNTER — Telehealth: Payer: Self-pay

## 2019-04-20 MED ORDER — HYDROCHLOROTHIAZIDE 25 MG PO TABS: 25.0000 mg | ORAL_TABLET | Freq: Every day | ORAL | 3 refills | Status: DC

## 2019-04-20 NOTE — Telephone Encounter (Signed)
Spoke to pt. She said since going back on HCTZ last week, she is seeing a decrease already. Asked that I do send in a rx for her. Made appt 05-28-19.

## 2019-04-20 NOTE — Telephone Encounter (Signed)
Pt said since 04/16/19 BP has been elevated. Pt started BC pills 2 months ago and pt has had slight increase in BP since starting BC pills and after taking first covid vaccine on 03/15/19 BP continues to rise; pt trying to watch salt intake; 2nd covid vaccine on 04/06/19.Over weekend diastolic in [id number] and highest reading 150/109. Pt is not sure if BC pill is effecting how labetalol 200 mg taking bid is working and pt recently had covid vaccine. Pt wants to know if can increase labetalol or try different BP med. This morning BP 150/88. Pt last seen for annual on 01/20/19 and then BP 136/88. Starting on 04/16/19 pt took HCTZ 25 mg one daily along with labetalol 200 mg bid to get BP lowered. BP now is 162/93 P 76. Pt is more stressed than usual. Pt does not want to schedule appt and request Dr Silvio Pate to review and see if can make suggestions over phone. Pt request cb after review. Pt has no covid symptoms, no travel and no known exposure to + covid. [facility] [address]/ [place].

## 2019-04-20 NOTE — Telephone Encounter (Signed)
I don't think increasing the labetalol is a good idea (pretty high dose now). I would have recommended starting HCTZ 25mg  daily----it takes a while for this to work and so she shouldn't be frustrated that it didn't bring it down with 1 dose Send Rx for #90 x 3 Set up appt for about a month to check BP and labs I would hope that he blood pressure should be coming down more consistently after 2-3 weeks of the HCTZ

## 2019-05-28 ENCOUNTER — Ambulatory Visit (INDEPENDENT_AMBULATORY_CARE_PROVIDER_SITE_OTHER): Payer: 59 | Admitting: Internal Medicine

## 2019-05-28 ENCOUNTER — Other Ambulatory Visit: Payer: Self-pay

## 2019-05-28 ENCOUNTER — Encounter: Payer: Self-pay | Admitting: Internal Medicine

## 2019-05-28 VITALS — BP 138/78 | HR 79 | Temp 97.8°F | Ht 63.0 in | Wt 245.0 lb

## 2019-05-28 DIAGNOSIS — I10 Essential (primary) hypertension: Secondary | ICD-10-CM

## 2019-05-28 DIAGNOSIS — K580 Irritable bowel syndrome with diarrhea: Secondary | ICD-10-CM | POA: Diagnosis not present

## 2019-05-28 DIAGNOSIS — J4520 Mild intermittent asthma, uncomplicated: Secondary | ICD-10-CM

## 2019-05-28 DIAGNOSIS — N898 Other specified noninflammatory disorders of vagina: Secondary | ICD-10-CM | POA: Diagnosis not present

## 2019-05-28 LAB — RENAL FUNCTION PANEL
Albumin: 3.6 g/dL (ref 3.5–5.2)
BUN: 11 mg/dL (ref 6–23)
CO2: 27 mEq/L (ref 19–32)
Calcium: 9.2 mg/dL (ref 8.4–10.5)
Chloride: 101 mEq/L (ref 96–112)
Creatinine, Ser: 0.89 mg/dL (ref 0.40–1.20)
GFR: 70.03 mL/min (ref >60.00)
Glucose, Bld: 131 mg/dL — ABNORMAL HIGH (ref 70–99)
Phosphorus: 3.3 mg/dL (ref 2.3–4.6)
Potassium: 4 mEq/L (ref 3.5–5.1)
Sodium: 135 mEq/L (ref 135–145)

## 2019-05-28 MED ORDER — ALBUTEROL SULFATE HFA 108 (90 BASE) MCG/ACT IN AERS: 2.0000 | INHALATION_SPRAY | Freq: Four times a day (QID) | RESPIRATORY_TRACT | 1 refills | Status: DC | PRN

## 2019-05-28 MED ORDER — FLUCONAZOLE 150 MG PO TABS: 150.0000 mg | ORAL_TABLET | Freq: Once | ORAL | 1 refills | Status: AC

## 2019-05-28 NOTE — Assessment & Plan Note (Signed)
BP Readings from Last 3 Encounters:  05/28/19 138/78  01/20/19 136/88  11/06/18 (!) 142/82   Doing better back on HCTZ No longer trying to conceive Will see if she can tolerate the 12.5 daily instead of every other day

## 2019-05-28 NOTE — Patient Instructions (Signed)
Please try to avoid lactose (milk sugar) for a while---or take lactaid pills before milk/cheese, etc. Also follow the low FODMAP diet for a few weeks. If you have ongoing problems, I can prescribe the antispasmotic for as needed use  Please try the HCTZ at 1/2 tab (12.5mg ) daily to see if you can tolerate that.

## 2019-05-28 NOTE — Assessment & Plan Note (Signed)
Ongoing issues Discussed lactose avoidance and low FODMAP diet If ongoing issues, can Rx hyoscyamine

## 2019-05-28 NOTE — Progress Notes (Signed)
Subjective:    Patient ID: Sherri Blanchard, female    DOB: [birth year], 39 y.o.   MRN: [id number]  HPI Here for follow up of HTN This visit occurred during the SARS-CoV-2 public health emergency.  Safety protocols were in place, including screening questions prior to the visit, additional usage of staff PPE, and extensive cleaning of exam room while observing appropriate contact time as indicated for disinfecting solutions.   BP has improved on the HCTZ Was taking daily but noticed widespread cramps Went down to every other day and the cramps went away  Having some vaginal symptoms---itching Wondered if it was yeast infection Intermittent--not better with OTC meds No discharge now Wonders if it is OCP  Some mild asthma symptoms Notices "fatgiue" at times Gets antsy and then some wheezing and SOB No regular cough Proair in the past and controller  Having some abdominal symptoms Will get gas and cramps after eating Then fecal urgency and frequency Usually still formed and better after going  Current Outpatient Medications on File Prior to Visit  Medication Sig Dispense Refill  . aspirin 81 MG chewable tablet Chew 81 mg by mouth daily.    . cetirizine (ZYRTEC) 10 MG tablet Take 10 mg by mouth daily.  11  . hydrochlorothiazide (HYDRODIURIL) 25 MG tablet Take 1 tablet (25 mg total) by mouth daily. (Patient taking differently: Take 25 mg by mouth daily. Every other day per pt) 90 tablet 3  . labetalol (NORMODYNE) 200 MG tablet Take 1 tablet (200 mg total) by mouth 2 (two) times daily. 60 tablet 11  . Multiple Vitamin (MULTIVITAMIN) tablet Take 1 tablet by mouth daily.    . norethindrone-ethinyl estradiol 1/35 (ORTHO-NOVUM) tablet Take 1 tablet by mouth daily. 1 Package 11  . triamcinolone cream (KENALOG) 0.1 % Apply 1 application topically 2 (two) times daily as needed. 45 g 1   No current facility-administered medications on file prior to visit.    Allergies  Allergen Reactions    . Amoxicillin-Pot Clavulanate Other (See Comments)    Yeast infection  Yeast infection per pt    Past Medical History:  Diagnosis Date  . Allergic rhinitis due to pollen   . Hypertension   . Infertility, female 61    Past Surgical History:  Procedure Laterality Date  . CESAREAN SECTION  2003    Family History  Problem Relation Age of Onset  . Hypertension Mother   . Heart disease Mother        had MI  . Heart attack Mother 72       April 2019  . Diabetes Father   . Hypertension Father   . Cancer Father        PROSTATE  . Hyperthyroidism Father 63       just DX April 2020  . Breast cancer Maternal Grandmother   . Cancer Maternal Grandmother        BONE  . Cancer Maternal Grandfather        LUNG  . Diabetes Paternal Grandmother     Social History   Socioeconomic History  . Marital status: Single    Spouse name: Not on file  . Number of children: 1  . Years of education: Not on file  . Highest education level: Not on file  Occupational History  . Occupation: Ship broker: [employer]  Tobacco Use  . Smoking status: Current Some Day Smoker    Packs/day: 0.10  . Smokeless tobacco: Never  Used  Substance and Sexual Activity  . Alcohol use: Yes    Comment: OCC  . Drug use: No  . Sexual activity: Yes  Other Topics Concern  . Not on file  Social History Narrative   With stable partner since 2011   Social Determinants of Health   Financial Resource Strain: Low Risk   . Difficulty of Paying Living Expenses: Not hard at all  Food Insecurity: No Food Insecurity  . Worried About Charity fundraiser in the Last Year: Never true  . Ran Out of Food in the Last Year: Never true  Transportation Needs: No Transportation Needs  . Lack of Transportation (Medical): No  . Lack of Transportation (Non-Medical): No  Physical Activity:   . Days of Exercise per Week:   . Minutes of Exercise per Session:   Stress:   . Feeling of Stress :    Social Connections:   . Frequency of Communication with Friends and Family:   . Frequency of Social Gatherings with Friends and Family:   . Attends Religious Services:   . Active Member of Clubs or Organizations:   . Attends Archivist Meetings:   Marland Kitchen Marital Status:   Intimate Partner Violence:   . Fear of Current or Ex-Partner:   . Emotionally Abused:   Marland Kitchen Physically Abused:   . Sexually Abused:    Review of Systems Sleep isn't great--but no cough, etc Appetite is normal    Objective:   Physical Exam  Constitutional: She appears well-developed. No distress.  Neck: No thyromegaly present.  Cardiovascular: Normal rate, regular rhythm and normal heart sounds. Exam reveals no gallop.  No murmur heard. Respiratory: Effort normal and breath sounds normal. No respiratory distress. She has no wheezes. She has no rales.  GI: Soft. There is no abdominal tenderness.  Musculoskeletal:        General: No edema.  Lymphadenopathy:    She has no cervical adenopathy.  Psychiatric: She has a normal mood and affect. Her behavior is normal.           Assessment & Plan:

## 2019-05-28 NOTE — Assessment & Plan Note (Signed)
Will try fluconazole If continues--- she will check with gyn

## 2019-05-28 NOTE — Assessment & Plan Note (Signed)
Will try albuterol as prn With spacer Consider controller med if ongoing symptoms

## 2019-09-23 ENCOUNTER — Encounter (HOSPITAL_COMMUNITY)
Admission: EM | Disposition: A | Discharge status: Home / Self Care | Payer: Self-pay | Source: Home / Self Care | Attending: Emergency Medicine

## 2019-09-23 ENCOUNTER — Observation Stay (HOSPITAL_COMMUNITY): Payer: 59 | Admitting: Certified Registered Nurse Anesthetist

## 2019-09-23 ENCOUNTER — Emergency Department (HOSPITAL_COMMUNITY): Payer: 59

## 2019-09-23 ENCOUNTER — Observation Stay (HOSPITAL_COMMUNITY)
Admission: EM | Admit: 2019-09-23 | Discharge: 2019-09-24 | Disposition: A | Discharge status: Home / Self Care | Payer: 59 | Attending: Emergency Medicine | Admitting: Emergency Medicine

## 2019-09-23 ENCOUNTER — Other Ambulatory Visit: Payer: Self-pay

## 2019-09-23 ENCOUNTER — Encounter (HOSPITAL_COMMUNITY): Payer: Self-pay | Admitting: *Deleted

## 2019-09-23 DIAGNOSIS — Z20822 Contact with and (suspected) exposure to covid-19: Secondary | ICD-10-CM | POA: Insufficient documentation

## 2019-09-23 DIAGNOSIS — I10 Essential (primary) hypertension: Secondary | ICD-10-CM | POA: Diagnosis not present

## 2019-09-23 DIAGNOSIS — K8050 Calculus of bile duct without cholangitis or cholecystitis without obstruction: Principal | ICD-10-CM | POA: Insufficient documentation

## 2019-09-23 DIAGNOSIS — Z7982 Long term (current) use of aspirin: Secondary | ICD-10-CM | POA: Insufficient documentation

## 2019-09-23 DIAGNOSIS — J45909 Unspecified asthma, uncomplicated: Secondary | ICD-10-CM | POA: Diagnosis not present

## 2019-09-23 DIAGNOSIS — R1011 Right upper quadrant pain: Secondary | ICD-10-CM | POA: Diagnosis present

## 2019-09-23 DIAGNOSIS — Z79899 Other long term (current) drug therapy: Secondary | ICD-10-CM | POA: Diagnosis not present

## 2019-09-23 DIAGNOSIS — F17210 Nicotine dependence, cigarettes, uncomplicated: Secondary | ICD-10-CM | POA: Diagnosis not present

## 2019-09-23 DIAGNOSIS — K810 Acute cholecystitis: Secondary | ICD-10-CM | POA: Diagnosis present

## 2019-09-23 HISTORY — PX: CHOLECYSTECTOMY: SHX55

## 2019-09-23 LAB — URINALYSIS, ROUTINE W REFLEX MICROSCOPIC
Bilirubin Urine: NEGATIVE
Glucose, UA: NEGATIVE mg/dL
Ketones, ur: NEGATIVE mg/dL
Leukocytes,Ua: NEGATIVE
Nitrite: NEGATIVE
Protein, ur: >=300 mg/dL — AB
Specific Gravity, Urine: 1.038 — ABNORMAL HIGH (ref 1.005–1.030)
pH: 5 (ref 5.0–8.0)

## 2019-09-23 LAB — I-STAT BETA HCG BLOOD, ED (MC, WL, AP ONLY): I-stat hCG, quantitative: <5 m[IU]/mL (ref <5)

## 2019-09-23 LAB — COMPREHENSIVE METABOLIC PANEL
ALT: 21 U/L (ref 0–44)
AST: 20 U/L (ref 15–41)
Albumin: 3.2 g/dL — ABNORMAL LOW (ref 3.5–5.0)
Alkaline Phosphatase: 72 U/L (ref 38–126)
Anion gap: 11 (ref 5–15)
BUN: 14 mg/dL (ref 6–20)
CO2: 24 mmol/L (ref 22–32)
Calcium: 9 mg/dL (ref 8.9–10.3)
Chloride: 104 mmol/L (ref 98–111)
Creatinine, Ser: 0.94 mg/dL (ref 0.44–1.00)
GFR calc Af Amer: >60 mL/min (ref >60)
GFR calc non Af Amer: >60 mL/min (ref >60)
Glucose, Bld: 118 mg/dL — ABNORMAL HIGH (ref 70–99)
Potassium: 3.7 mmol/L (ref 3.5–5.1)
Sodium: 139 mmol/L (ref 135–145)
Total Bilirubin: 0.4 mg/dL (ref 0.3–1.2)
Total Protein: 6.9 g/dL (ref 6.5–8.1)

## 2019-09-23 LAB — CBC
HCT: 39.9 % (ref 36.0–46.0)
Hemoglobin: 13.6 g/dL (ref 12.0–15.0)
MCH: 29.8 pg (ref 26.0–34.0)
MCHC: 34.1 g/dL (ref 30.0–36.0)
MCV: 87.5 fL (ref 80.0–100.0)
Platelets: 376 10*3/uL (ref 150–400)
RBC: 4.56 MIL/uL (ref 3.87–5.11)
RDW: 12.3 % (ref 11.5–15.5)
WBC: 15.8 10*3/uL — ABNORMAL HIGH (ref 4.0–10.5)
nRBC: 0 % (ref 0.0–0.2)

## 2019-09-23 LAB — SARS CORONAVIRUS 2 BY RT PCR (HOSPITAL ORDER, PERFORMED IN ~~LOC~~ HOSPITAL LAB): SARS Coronavirus 2: NEGATIVE

## 2019-09-23 LAB — HIV ANTIBODY (ROUTINE TESTING W REFLEX): HIV Screen 4th Generation wRfx: NONREACTIVE

## 2019-09-23 LAB — LIPASE, BLOOD: Lipase: 29 U/L (ref 11–51)

## 2019-09-23 SURGERY — LAPAROSCOPIC CHOLECYSTECTOMY
Anesthesia: General | Site: Abdomen

## 2019-09-23 MED ORDER — ONDANSETRON 4 MG PO TBDP: 4.0000 mg | ORAL_TABLET | Freq: Four times a day (QID) | ORAL | Status: DC | PRN

## 2019-09-23 MED ORDER — ONDANSETRON HCL 4 MG/2ML IJ SOLN
INTRAMUSCULAR | Status: AC
  Filled 2019-09-23: qty 2

## 2019-09-23 MED ORDER — PROPOFOL 10 MG/ML IV BOLUS
INTRAVENOUS | Status: DC | PRN
  Administered 2019-09-23: 170 mg via INTRAVENOUS

## 2019-09-23 MED ORDER — CHLORHEXIDINE GLUCONATE 0.12 % MT SOLN: 15.0000 mL | Freq: Once | OROMUCOSAL | Status: AC

## 2019-09-23 MED ORDER — SIMETHICONE 80 MG PO CHEW
40.0000 mg | CHEWABLE_TABLET | Freq: Four times a day (QID) | ORAL | Status: DC | PRN
  Administered 2019-09-23: 40 mg via ORAL
  Filled 2019-09-23 (×2): qty 1

## 2019-09-23 MED ORDER — ENOXAPARIN SODIUM 40 MG/0.4ML ~~LOC~~ SOLN: 40.0000 mg | SUBCUTANEOUS | Status: DC

## 2019-09-23 MED ORDER — ONDANSETRON HCL 4 MG/2ML IJ SOLN
INTRAMUSCULAR | Status: DC | PRN
  Administered 2019-09-23: 4 mg via INTRAVENOUS

## 2019-09-23 MED ORDER — FENTANYL CITRATE (PF) 100 MCG/2ML IJ SOLN
25.0000 ug | INTRAMUSCULAR | Status: DC | PRN
  Administered 2019-09-23 (×2): 25 ug via INTRAVENOUS

## 2019-09-23 MED ORDER — DIPHENHYDRAMINE HCL 50 MG/ML IJ SOLN: 25.0000 mg | Freq: Four times a day (QID) | INTRAMUSCULAR | Status: DC | PRN

## 2019-09-23 MED ORDER — FENTANYL CITRATE (PF) 100 MCG/2ML IJ SOLN
INTRAMUSCULAR | Status: AC
  Filled 2019-09-23: qty 2

## 2019-09-23 MED ORDER — HYDROCHLOROTHIAZIDE 25 MG PO TABS
25.0000 mg | ORAL_TABLET | Freq: Every day | ORAL | Status: DC
  Administered 2019-09-23 – 2019-09-24 (×2): 25 mg via ORAL
  Filled 2019-09-23 (×2): qty 1

## 2019-09-23 MED ORDER — OXYCODONE HCL 5 MG PO TABS: 5.0000 mg | ORAL_TABLET | Freq: Once | ORAL | Status: DC | PRN

## 2019-09-23 MED ORDER — FENTANYL CITRATE (PF) 250 MCG/5ML IJ SOLN
INTRAMUSCULAR | Status: AC
  Filled 2019-09-23: qty 5

## 2019-09-23 MED ORDER — DOCUSATE SODIUM 100 MG PO CAPS
100.0000 mg | ORAL_CAPSULE | Freq: Two times a day (BID) | ORAL | Status: DC
  Administered 2019-09-23 – 2019-09-24 (×3): 100 mg via ORAL
  Filled 2019-09-23 (×3): qty 1

## 2019-09-23 MED ORDER — PROPOFOL 10 MG/ML IV BOLUS
INTRAVENOUS | Status: AC
  Filled 2019-09-23: qty 20

## 2019-09-23 MED ORDER — ORAL CARE MOUTH RINSE: 15.0000 mL | Freq: Once | OROMUCOSAL | Status: AC

## 2019-09-23 MED ORDER — STERILE WATER FOR IRRIGATION IR SOLN
Status: DC | PRN
  Administered 2019-09-23: 1000 mL

## 2019-09-23 MED ORDER — PHENYLEPHRINE 40 MCG/ML (10ML) SYRINGE FOR IV PUSH (FOR BLOOD PRESSURE SUPPORT)
PREFILLED_SYRINGE | INTRAVENOUS | Status: AC
  Filled 2019-09-23: qty 10

## 2019-09-23 MED ORDER — PANTOPRAZOLE SODIUM 40 MG IV SOLR
40.0000 mg | Freq: Every day | INTRAVENOUS | Status: DC
  Administered 2019-09-23: 40 mg via INTRAVENOUS
  Filled 2019-09-23: qty 40

## 2019-09-23 MED ORDER — BUPIVACAINE HCL (PF) 0.25 % IJ SOLN
INTRAMUSCULAR | Status: AC
  Filled 2019-09-23: qty 30

## 2019-09-23 MED ORDER — ALBUTEROL SULFATE HFA 108 (90 BASE) MCG/ACT IN AERS
INHALATION_SPRAY | RESPIRATORY_TRACT | Status: DC | PRN
Start: 2019-09-23 — End: 2019-09-23
  Administered 2019-09-23: 6 via RESPIRATORY_TRACT

## 2019-09-23 MED ORDER — OXYCODONE HCL 5 MG PO TABS
5.0000 mg | ORAL_TABLET | ORAL | Status: DC | PRN
  Administered 2019-09-23 – 2019-09-24 (×2): 5 mg via ORAL
  Filled 2019-09-23 (×2): qty 1

## 2019-09-23 MED ORDER — SODIUM CHLORIDE 0.45 % IV SOLN: INTRAVENOUS | Status: DC

## 2019-09-23 MED ORDER — ACETAMINOPHEN 650 MG RE SUPP: 650.0000 mg | Freq: Four times a day (QID) | RECTAL | Status: DC | PRN

## 2019-09-23 MED ORDER — ALBUTEROL SULFATE (2.5 MG/3ML) 0.083% IN NEBU: 2.5000 mg | INHALATION_SOLUTION | Freq: Four times a day (QID) | RESPIRATORY_TRACT | Status: DC | PRN

## 2019-09-23 MED ORDER — SODIUM CHLORIDE 0.9 % IR SOLN
Status: DC | PRN
  Administered 2019-09-23: 1000 mL

## 2019-09-23 MED ORDER — CHLORHEXIDINE GLUCONATE 0.12 % MT SOLN
OROMUCOSAL | Status: AC
  Administered 2019-09-23: 15 mL via OROMUCOSAL
  Filled 2019-09-23: qty 15

## 2019-09-23 MED ORDER — METOPROLOL TARTRATE 5 MG/5ML IV SOLN: 5.0000 mg | Freq: Four times a day (QID) | INTRAVENOUS | Status: DC | PRN

## 2019-09-23 MED ORDER — 0.9 % SODIUM CHLORIDE (POUR BTL) OPTIME
TOPICAL | Status: DC | PRN
  Administered 2019-09-23: 1000 mL

## 2019-09-23 MED ORDER — POLYETHYLENE GLYCOL 3350 17 G PO PACK: 17.0000 g | PACK | Freq: Every day | ORAL | Status: DC | PRN

## 2019-09-23 MED ORDER — MIDAZOLAM HCL 5 MG/5ML IJ SOLN
INTRAMUSCULAR | Status: DC | PRN
  Administered 2019-09-23: 2 mg via INTRAVENOUS

## 2019-09-23 MED ORDER — ACETAMINOPHEN 325 MG PO TABS: 650.0000 mg | ORAL_TABLET | Freq: Four times a day (QID) | ORAL | Status: DC | PRN

## 2019-09-23 MED ORDER — OXYCODONE HCL 5 MG/5ML PO SOLN: 5.0000 mg | Freq: Once | ORAL | Status: DC | PRN

## 2019-09-23 MED ORDER — ONDANSETRON HCL 4 MG/2ML IJ SOLN
4.0000 mg | Freq: Four times a day (QID) | INTRAMUSCULAR | Status: DC | PRN
  Administered 2019-09-23: 4 mg via INTRAVENOUS
  Filled 2019-09-23: qty 2

## 2019-09-23 MED ORDER — LIDOCAINE 2% (20 MG/ML) 5 ML SYRINGE
INTRAMUSCULAR | Status: DC | PRN
  Administered 2019-09-23: 40 mg via INTRAVENOUS

## 2019-09-23 MED ORDER — LACTATED RINGERS IV SOLN: INTRAVENOUS | Status: DC

## 2019-09-23 MED ORDER — DEXAMETHASONE SODIUM PHOSPHATE 10 MG/ML IJ SOLN
INTRAMUSCULAR | Status: DC | PRN
  Administered 2019-09-23: 10 mg via INTRAVENOUS

## 2019-09-23 MED ORDER — SODIUM CHLORIDE 0.9% FLUSH: 3.0000 mL | Freq: Once | INTRAVENOUS | Status: DC

## 2019-09-23 MED ORDER — LABETALOL HCL 100 MG PO TABS
200.0000 mg | ORAL_TABLET | Freq: Two times a day (BID) | ORAL | Status: DC
  Administered 2019-09-23 – 2019-09-24 (×3): 200 mg via ORAL
  Filled 2019-09-23: qty 2
  Filled 2019-09-23: qty 1
  Filled 2019-09-23: qty 2

## 2019-09-23 MED ORDER — MIDAZOLAM HCL 2 MG/2ML IJ SOLN
INTRAMUSCULAR | Status: AC
  Filled 2019-09-23: qty 2

## 2019-09-23 MED ORDER — METHOCARBAMOL 500 MG PO TABS: 500.0000 mg | ORAL_TABLET | Freq: Four times a day (QID) | ORAL | Status: DC | PRN

## 2019-09-23 MED ORDER — DIPHENHYDRAMINE HCL 25 MG PO CAPS: 25.0000 mg | ORAL_CAPSULE | Freq: Four times a day (QID) | ORAL | Status: DC | PRN

## 2019-09-23 MED ORDER — ONDANSETRON HCL 4 MG/2ML IJ SOLN: 4.0000 mg | Freq: Once | INTRAMUSCULAR | Status: DC | PRN

## 2019-09-23 MED ORDER — BUPIVACAINE HCL 0.25 % IJ SOLN
INTRAMUSCULAR | Status: DC | PRN
  Administered 2019-09-23: 30 mL

## 2019-09-23 MED ORDER — SUGAMMADEX SODIUM 200 MG/2ML IV SOLN
INTRAVENOUS | Status: DC | PRN
  Administered 2019-09-23: 200 mg via INTRAVENOUS

## 2019-09-23 MED ORDER — FENTANYL CITRATE (PF) 100 MCG/2ML IJ SOLN
INTRAMUSCULAR | Status: DC | PRN
  Administered 2019-09-23: 100 ug via INTRAVENOUS
  Administered 2019-09-23: 50 ug via INTRAVENOUS

## 2019-09-23 MED ORDER — SODIUM CHLORIDE 0.9 % IV SOLN
2.0000 g | Freq: Once | INTRAVENOUS | Status: AC
  Administered 2019-09-23: 2 g via INTRAVENOUS
  Filled 2019-09-23: qty 20

## 2019-09-23 MED ORDER — LORATADINE 10 MG PO TABS
10.0000 mg | ORAL_TABLET | Freq: Every day | ORAL | Status: DC
  Administered 2019-09-23 – 2019-09-24 (×2): 10 mg via ORAL
  Filled 2019-09-23 (×2): qty 1

## 2019-09-23 MED ORDER — ROCURONIUM BROMIDE 10 MG/ML (PF) SYRINGE
PREFILLED_SYRINGE | INTRAVENOUS | Status: DC | PRN
  Administered 2019-09-23: 50 mg via INTRAVENOUS

## 2019-09-23 MED ORDER — FENTANYL CITRATE (PF) 100 MCG/2ML IJ SOLN: 25.0000 ug | INTRAMUSCULAR | Status: DC | PRN

## 2019-09-23 MED ORDER — HYDROMORPHONE HCL 1 MG/ML IJ SOLN
0.5000 mg | INTRAMUSCULAR | Status: DC | PRN
  Administered 2019-09-23: 0.5 mg via INTRAVENOUS

## 2019-09-23 SURGICAL SUPPLY — 47 items
ADH SKN CLS APL DERMABOND .7 (GAUZE/BANDAGES/DRESSINGS) ×1
APL PRP STRL LF DISP 70% ISPRP (MISCELLANEOUS) ×1
APPLIER CLIP 5 13 M/L LIGAMAX5 (MISCELLANEOUS) ×3
APR CLP MED LRG 5 ANG JAW (MISCELLANEOUS) ×1
BAG SPEC RTRVL 10 TROC 200 (ENDOMECHANICALS) ×1
BAG SPEC RTRVL LRG 6X4 10 (ENDOMECHANICALS)
BLADE CLIPPER SURG (BLADE) IMPLANT
CANISTER SUCT 3000ML PPV (MISCELLANEOUS) ×3 IMPLANT
CHLORAPREP W/TINT 26 (MISCELLANEOUS) ×3 IMPLANT
CLIP APPLIE 5 13 M/L LIGAMAX5 (MISCELLANEOUS) ×1 IMPLANT
COVER SURGICAL LIGHT HANDLE (MISCELLANEOUS) ×3 IMPLANT
DERMABOND ADVANCED (GAUZE/BANDAGES/DRESSINGS) ×2
DERMABOND ADVANCED .7 DNX12 (GAUZE/BANDAGES/DRESSINGS) ×1 IMPLANT
DISSECTOR BLUNT TIP ENDO 5MM (MISCELLANEOUS) ×2 IMPLANT
ELECT CAUTERY BLADE 6.4 (BLADE) ×3 IMPLANT
ELECT REM PT RETURN 9FT ADLT (ELECTROSURGICAL) ×3
ELECTRODE REM PT RTRN 9FT ADLT (ELECTROSURGICAL) ×1 IMPLANT
GLOVE BIO SURGEON STRL SZ 6.5 (GLOVE) ×2 IMPLANT
GLOVE BIO SURGEONS STRL SZ 6.5 (GLOVE) ×1
GLOVE BIOGEL PI IND STRL 6 (GLOVE) ×1 IMPLANT
GLOVE BIOGEL PI INDICATOR 6 (GLOVE) ×2
GOWN STRL REUS W/ TWL LRG LVL3 (GOWN DISPOSABLE) ×3 IMPLANT
GOWN STRL REUS W/TWL LRG LVL3 (GOWN DISPOSABLE) ×9
KIT BASIN OR (CUSTOM PROCEDURE TRAY) ×3 IMPLANT
KIT TURNOVER KIT B (KITS) ×3 IMPLANT
L-HOOK LAP DISP 36CM (ELECTROSURGICAL) ×3
LHOOK LAP DISP 36CM (ELECTROSURGICAL) IMPLANT
NS IRRIG 1000ML POUR BTL (IV SOLUTION) ×3 IMPLANT
PAD ARMBOARD 7.5X6 YLW CONV (MISCELLANEOUS) ×3 IMPLANT
PENCIL BUTTON HOLSTER BLD 10FT (ELECTRODE) ×3 IMPLANT
POUCH RETRIEVAL ECOSAC 10 (ENDOMECHANICALS) IMPLANT
POUCH RETRIEVAL ECOSAC 10MM (ENDOMECHANICALS) ×3
POUCH SPECIMEN RETRIEVAL 10MM (ENDOMECHANICALS) ×1 IMPLANT
SCISSORS LAP 5X35 DISP (ENDOMECHANICALS) ×3 IMPLANT
SET IRRIG TUBING LAPAROSCOPIC (IRRIGATION / IRRIGATOR) ×3 IMPLANT
SET TUBE SMOKE EVAC HIGH FLOW (TUBING) ×3 IMPLANT
SLEEVE ENDOPATH XCEL 5M (ENDOMECHANICALS) ×6 IMPLANT
SPECIMEN JAR SMALL (MISCELLANEOUS) ×3 IMPLANT
SUT MNCRL AB 4-0 PS2 18 (SUTURE) ×5 IMPLANT
SUT VICRYL 0 AB UR-6 (SUTURE) IMPLANT
SUT VICRYL 0 UR6 27IN ABS (SUTURE) ×6 IMPLANT
TOWEL GREEN STERILE (TOWEL DISPOSABLE) ×3 IMPLANT
TOWEL GREEN STERILE FF (TOWEL DISPOSABLE) ×3 IMPLANT
TRAY LAPAROSCOPIC MC (CUSTOM PROCEDURE TRAY) ×3 IMPLANT
TROCAR XCEL BLUNT TIP 100MML (ENDOMECHANICALS) ×3 IMPLANT
TROCAR XCEL NON-BLD 5MMX100MML (ENDOMECHANICALS) ×3 IMPLANT
WATER STERILE IRR 1000ML POUR (IV SOLUTION) ×3 IMPLANT

## 2019-09-23 NOTE — ED Notes (Signed)
Unable to scan medications given tot he pt, room computer broken and IT notified, no scanner available on the room or around. Medications reviewed with the pt at the bedside.

## 2019-09-23 NOTE — H&P (Signed)
[facility] Admission Note  Sherri Blanchard 30-Sep-1980  [id number].    Requesting MD: Gerlene Fee Chief Complaint/Reason for Consult: cholecystitis  HPI:  Sherri Blanchard is a 39yo female PMH asthma, HTN, and obesity BMI 44.26 who presented to [facility] today complaining of worsening abdominal pain. States that she woke up around 0100 today with severe epigastric pain. Pain radiates into RUQ and into her back. Associated with multiple episodes of nausea and vomiting. Unsure if worse with PO intake. She tried taking motrin but this did not help. States that she has had similar but less severe symptoms over the last 2-3 months, particularly over the last 2 weeks. In the ED her BP is elevated 161/88, otherwise VSS. U/s shows Cholelithiasis with gallbladder wall thickening and pericholecystic fluid which is suspicious for acute cholecystitis. WBC 15.8, LFTs and lipase WNL.  General surgery asked to see.  Last meal last night.  Abdominal surgical history: c section Anticoagulants: none Nonsmoker Employment: Psychologist, sport and exercise at pediatric office  Review of Systems  Constitutional: Negative.   Respiratory: Negative.   Cardiovascular: Negative.   Gastrointestinal: Positive for abdominal pain, nausea and vomiting.  Genitourinary: Positive for frequency. Negative for dysuria.  Musculoskeletal: Positive for back pain.  Skin: Negative.     All systems reviewed and otherwise negative except for as above  Family History  Problem Relation Age of Onset  . Hypertension Mother   . Heart disease Mother        had MI  . Heart attack Mother 72       April 2019  . Diabetes Father   . Hypertension Father   . Cancer Father        PROSTATE  . Hyperthyroidism Father 60       just DX April 2020  . Breast cancer Maternal Grandmother   . Cancer Maternal Grandmother        BONE  . Cancer Maternal Grandfather        LUNG  . Diabetes Paternal Grandmother     Past Medical History:  Diagnosis  Date  . Allergic rhinitis due to pollen   . Hypertension   . Infertility, female 54    Past Surgical History:  Procedure Laterality Date  . CESAREAN SECTION  2003    Social History:  reports that she has been smoking. She has been smoking about 0.10 packs per day. She has never used smokeless tobacco. She reports current alcohol use. She reports that she does not use drugs.  Allergies:  Allergies  Allergen Reactions  . Amoxicillin-Pot Clavulanate Other (See Comments)    Yeast infection  Yeast infection per pt    (Not in a hospital admission)   Prior to Admission medications   Medication Sig Start Date End Date Taking? Authorizing Provider  albuterol (VENTOLIN HFA) 108 (90 Base) MCG/ACT inhaler Inhale 2 puffs into the lungs every 6 (six) hours as needed for wheezing or shortness of breath. 05/28/19  Yes Venia Carbon, MD  aspirin 81 MG chewable tablet Chew 81 mg by mouth daily.   Yes [provider]  cetirizine (ZYRTEC) 10 MG tablet Take 10 mg by mouth daily. 02/22/17  Yes [provider]  famotidine (PEPCID) 20 MG tablet Take 20 mg by mouth as needed for heartburn or indigestion.   Yes [provider]  hydrochlorothiazide (HYDRODIURIL) 25 MG tablet Take 1 tablet (25 mg total) by mouth daily. 04/20/19  Yes Viviana Simpler I, MD  labetalol (NORMODYNE) 200 MG tablet Take 1  tablet (200 mg total) by mouth 2 (two) times daily. 10/16/18  Yes Venia Carbon, MD  Multiple Vitamin (MULTIVITAMIN) tablet Take 1 tablet by mouth daily.   Yes [provider]  norethindrone-ethinyl estradiol 1/35 (ORTHO-NOVUM) tablet Take 1 tablet by mouth daily. 02/25/19  Yes Viviana Simpler I, MD  triamcinolone cream (KENALOG) 0.1 % Apply 1 application topically 2 (two) times daily as needed. Patient not taking: Reported on 09/23/2019 10/16/18   Viviana Simpler I, MD    Blood pressure (!) 161/88, pulse 76, temperature 98.4 F (36.9 C), temperature source Oral, resp. rate  20, height 5\' 2"  (1.575 m), weight 109.8 kg, last menstrual period 08/24/2019, SpO2 98 %. Physical Exam: General: pleasant, female who is laying in bed in NAD HEENT: head is normocephalic, atraumatic.  Sclera are noninjected.  Pupils equal and round.  Ears and nose without any masses or lesions.  Mouth is pink and moist. Dentition fair Heart: regular, rate, and rhythm.  Normal s1,s2. No obvious murmurs, gallops, or rubs noted.  Palpable pedal pulses bilaterally  Lungs: CTAB, no wheezes, rhonchi, or rales noted.  Respiratory effort nonlabored Abd: obese, soft, ND, +BS, no masses, hernias, or organomegaly. Mild epigastric and RUQ TTP without rebound or guarding MS: no BUE/BLE edema, calves soft and nontender Skin: warm and dry with no masses, lesions, or rashes Psych: A&Ox4 with an appropriate affect Neuro: cranial nerves grossly intact, equal strength in BUE/BLE bilaterally, normal speech, thought process intact  Results for orders placed or performed during the hospital encounter of 09/23/19 (from the past 48 hour(s))  Urinalysis, Routine w reflex microscopic     Status: Abnormal   Collection Time: 09/23/19  3:29 AM  Result Value Ref Range   Color, Urine AMBER (A) YELLOW    Comment: BIOCHEMICALS MAY BE AFFECTED BY COLOR   APPearance CLOUDY (A) CLEAR   Specific Gravity, Urine 1.038 (H) 1.005 - 1.030   pH 5.0 5.0 - 8.0   Glucose, UA NEGATIVE NEGATIVE mg/dL   Hgb urine dipstick MODERATE (A) NEGATIVE   Bilirubin Urine NEGATIVE NEGATIVE   Ketones, ur NEGATIVE NEGATIVE mg/dL   Protein, ur >=300 (A) NEGATIVE mg/dL   Nitrite NEGATIVE NEGATIVE   Leukocytes,Ua NEGATIVE NEGATIVE   RBC / HPF 21-50 0 - 5 RBC/hpf   WBC, UA 0-5 0 - 5 WBC/hpf   Bacteria, UA RARE (A) NONE SEEN   Squamous Epithelial / LPF 21-50 0 - 5   Mucus PRESENT     Comment: Performed at [facility], [address], [place], [place] [address]  Lipase, blood     Status: None   Collection Time: 09/23/19  3:38 AM  Result  Value Ref Range   Lipase 29 11 - 51 U/L    Comment: Performed at [facility] [address] [place], [place] [address]  Comprehensive metabolic panel     Status: Abnormal   Collection Time: 09/23/19  3:38 AM  Result Value Ref Range   Sodium 139 135 - 145 mmol/L   Potassium 3.7 3.5 - 5.1 mmol/L   Chloride 104 98 - 111 mmol/L   CO2 24 22 - 32 mmol/L   Glucose, Bld 118 (H) 70 - 99 mg/dL    Comment: Glucose reference range applies only to samples taken after fasting for at least 8 hours.   BUN 14 6 - 20 mg/dL   Creatinine, Ser 0.94 0.44 - 1.00 mg/dL   Calcium 9.0 8.9 - 10.3 mg/dL   Total  Protein 6.9 6.5 - 8.1 g/dL   Albumin 3.2 (L) 3.5 - 5.0 g/dL   AST 20 15 - 41 U/L   ALT 21 0 - 44 U/L   Alkaline Phosphatase 72 38 - 126 U/L   Total Bilirubin 0.4 0.3 - 1.2 mg/dL   GFR calc non Af Amer >60 >60 mL/min   GFR calc Af Amer >60 >60 mL/min   Anion gap 11 5 - 15    Comment: Performed at [facility] [address], [place], [place] [address]  CBC     Status: Abnormal   Collection Time: 09/23/19  3:38 AM  Result Value Ref Range   WBC 15.8 (H) 4.0 - 10.5 K/uL   RBC 4.56 3.87 - 5.11 MIL/uL   Hemoglobin 13.6 12.0 - 15.0 g/dL   HCT 39.9 36 - 46 %   MCV 87.5 80.0 - 100.0 fL   MCH 29.8 26.0 - 34.0 pg   MCHC 34.1 30.0 - 36.0 g/dL   RDW 12.3 11.5 - 15.5 %   Platelets 376 150 - 400 K/uL   nRBC 0.0 0.0 - 0.2 %    Comment: Performed at [facility], [place] [address], [place], [place] [address]  I-Stat beta hCG blood, ED     Status: None   Collection Time: 09/23/19  3:44 AM  Result Value Ref Range   I-stat hCG, quantitative <5.0 <5 mIU/mL   Comment 3            Comment:   GEST. AGE      CONC.  (mIU/mL)   <=1 WEEK        5 - 50     2 WEEKS       50 - 500     3 WEEKS       100 - 10,000     4 WEEKS     1,000 - 30,000        FEMALE AND NON-PREGNANT FEMALE:     LESS THAN 5 mIU/mL    US Abdomen Limited RUQ  Result Date: 09/23/2019 CLINICAL DATA:  Epigastric pain for 6 hours  EXAM: ULTRASOUND ABDOMEN LIMITED RIGHT UPPER QUADRANT COMPARISON:  04/27/2017 FINDINGS: Gallbladder: There are numerous echogenic, shadowing gallstones within the gallbladder lumen, largest measuring up to 1.5 cm in diameter. Diffuse gallbladder wall thickening measuring 0.4 cm. Small amount of pericholecystic fluid/edema. No sonographic Murphy sign noted by sonographer. Common bile duct: Diameter: 4 mm. Liver: No focal lesion identified. Within normal limits in parenchymal echogenicity. Portal vein is patent on color Doppler imaging with normal direction of blood flow towards the liver. Other: None. IMPRESSION: Cholelithiasis with gallbladder wall thickening and pericholecystic fluid which is suspicious for acute cholecystitis. Electronically Signed   By: Davina Poke D.O.   On: 09/23/2019 08:21      Assessment/Plan Asthma HTN Obesity BMI 44.26  Seasonal allergies  Acute cholecystitis - Patient with clinical and radiographic findings consistent with acute cholecystitis. We discussed the risks and benefits to cholecystectomy and she wishes to proceed.  Will admit to med-sug for observation. Plan for laparoscopic cholecystectomy today. Keep NPO. IV rocephin has been ordered. covid test is pending.  ID - rocephin VTE - SCDs, lovenox FEN - IVF, NPO Foley - none Follow up - TBD  Wellington Hampshire, [facility] 09/23/2019, 11:36 AM Please see Amion for pager number during day hours 7:00am-4:30pm

## 2019-09-23 NOTE — Progress Notes (Signed)
Patient ambulated to bathroom with one person assistance, voided x1. Ambulated in hallway with walker. Patient states she is passing gas. Sitting up in recliner. Will monitor.

## 2019-09-23 NOTE — Progress Notes (Signed)
Received patient from PACU. Patient alert and oriented x4. Complaining of abdominal pain. 4 lap sites clean, dry, intact. Patient instructed to utilize call bell should she need assistance. Call bell within reach. Will continue to monitor.

## 2019-09-23 NOTE — ED Notes (Signed)
Breakfast Ordered--Shalon Salado  

## 2019-09-23 NOTE — Transfer of Care (Signed)
Immediate Anesthesia Transfer of Care Note  Patient: Sherri Blanchard  Procedure(s) Performed: LAPAROSCOPIC CHOLECYSTECTOMY (N/A Abdomen)  Patient Location: PACU  Anesthesia Type:General  Level of Consciousness: awake and drowsy  Airway & Oxygen Therapy: Patient Spontanous Breathing and Patient connected to face mask oxygen  Post-op Assessment: Report given to RN and Post -op Vital signs reviewed and stable  Post vital signs: Reviewed and stable  Last Vitals:  Vitals Value Taken Time  BP 131/80 09/23/19 1552  Temp    Pulse 88 09/23/19 1556  Resp 24 09/23/19 1556  SpO2 100 % 09/23/19 1556  Vitals shown include unvalidated device data.  Last Pain:  Vitals:   09/23/19 1335  TempSrc:   PainSc: 0-No pain         Complications: No complications documented.

## 2019-09-23 NOTE — Anesthesia Preprocedure Evaluation (Signed)
Anesthesia Evaluation  Patient identified by MRN, date of birth, ID band Patient awake    Reviewed: Allergy & Precautions, NPO status , Patient's Chart, lab work & pertinent test results  Airway Mallampati: II  TM Distance: >3 FB Neck ROM: Full    Dental  (+) Teeth Intact, Dental Advisory Given   Pulmonary Current Smoker and Patient abstained from smoking.,    breath sounds clear to auscultation       Cardiovascular hypertension,  Rhythm:Regular Rate:Normal     Neuro/Psych    GI/Hepatic   Endo/Other    Renal/GU      Musculoskeletal   Abdominal (+) + obese,   Peds  Hematology   Anesthesia Other Findings   Reproductive/Obstetrics                             Anesthesia Physical Anesthesia Plan  ASA: III  Anesthesia Plan: General   Post-op Pain Management:    Induction: Intravenous  PONV Risk Score and Plan: Ondansetron and Dexamethasone  Airway Management Planned: Oral ETT  Additional Equipment:   Intra-op Plan:   Post-operative Plan: Extubation in OR  Informed Consent: I have reviewed the patients History and Physical, chart, labs and discussed the procedure including the risks, benefits and alternatives for the proposed anesthesia with the patient or authorized representative who has indicated his/her understanding and acceptance.     Dental advisory given  Plan Discussed with: CRNA and Anesthesiologist  Anesthesia Plan Comments:         Anesthesia Quick Evaluation

## 2019-09-23 NOTE — ED Provider Notes (Signed)
[facility] EMERGENCY DEPARTMENT Provider Note   CSN: [id number] Arrival date & time: 09/23/19  0302     History Chief Complaint  Patient presents with  . Abdominal Pain    Kmari Brian is a 39 y.o. female with a history of hypertension, IBS, lupus anticoagulant positive, & prior c-section who presents to the ED with complaints of abdominal pain that began @ 0200 this AM and is fairly resolved @ present. Patient states she woke from sleep with pain to the epigastrium & RUQ which radiated to her scapula. Pain was constant, lasted several hours, associated with nausea and multiple episodes of emesis. She took some ibuprofen PTA which she thinks may have started to help as her pain eased off. She currently is not in any pain at rest. She had a similar episode that lasted about 20 minutes a few months ago that spontaneously resolved after vomiting. She denies fever, chills, hematemesis, dyspnea, melena, hematochezia, diarrhea, constipation, vaginal bleeding/dishcarge, or dysuria.   Last PO intake was meatloaf for dinner last night around 19:30.   HPI     Past Medical History:  Diagnosis Date  . Allergic rhinitis due to pollen   . Hypertension   . Infertility, female 2015    Patient Active Problem List   Diagnosis Date Noted  . Mild intermittent asthma, uncomplicated 35/45/6256  . Vaginal itching 05/28/2019  . Preventative health care 01/20/2019  . Lupus anticoagulant positive 10/16/2018  . Essential hypertension, benign 06/30/2018  . Irritable bowel syndrome 06/30/2018  . Allergic rhinitis due to pollen   . Female infertility 08/27/2013    Past Surgical History:  Procedure Laterality Date  . CESAREAN SECTION  2003     OB History    Gravida  1   Para  1   Term      Preterm      AB      Living  1     SAB      TAB      Ectopic      Multiple      Live Births  1           Family History  Problem Relation Age of Onset  .  Hypertension Mother   . Heart disease Mother        had MI  . Heart attack Mother 72       April 2019  . Diabetes Father   . Hypertension Father   . Cancer Father        PROSTATE  . Hyperthyroidism Father 43       just DX April 2020  . Breast cancer Maternal Grandmother   . Cancer Maternal Grandmother        BONE  . Cancer Maternal Grandfather        LUNG  . Diabetes Paternal Grandmother     Social History   Tobacco Use  . Smoking status: Current Some Day Smoker    Packs/day: 0.10  . Smokeless tobacco: Never Used  Vaping Use  . Vaping Use: Never used  Substance Use Topics  . Alcohol use: Yes    Comment: OCC  . Drug use: No    Home Medications Prior to Admission medications   Medication Sig Start Date End Date Taking? Authorizing Provider  albuterol (VENTOLIN HFA) 108 (90 Base) MCG/ACT inhaler Inhale 2 puffs into the lungs every 6 (six) hours as needed for wheezing or shortness of breath. 05/28/19   Venia Carbon, MD  aspirin 81 MG chewable tablet Chew 81 mg by mouth daily.    [provider]  cetirizine (ZYRTEC) 10 MG tablet Take 10 mg by mouth daily. 02/22/17   [provider]  hydrochlorothiazide (HYDRODIURIL) 25 MG tablet Take 1 tablet (25 mg total) by mouth daily. Patient taking differently: Take 25 mg by mouth daily. Every other day per pt 04/20/19   Venia Carbon, MD  labetalol (NORMODYNE) 200 MG tablet Take 1 tablet (200 mg total) by mouth 2 (two) times daily. 10/16/18   Venia Carbon, MD  Multiple Vitamin (MULTIVITAMIN) tablet Take 1 tablet by mouth daily.    [provider]  norethindrone-ethinyl estradiol 1/35 (ORTHO-NOVUM) tablet Take 1 tablet by mouth daily. 02/25/19   Venia Carbon, MD  triamcinolone cream (KENALOG) 0.1 % Apply 1 application topically 2 (two) times daily as needed. 10/16/18   Venia Carbon, MD    Allergies    Amoxicillin-pot clavulanate  Review of Systems   Review of Systems  Constitutional:  Negative for chills and fever.  Respiratory: Negative for shortness of breath.   Gastrointestinal: Positive for abdominal pain, nausea and vomiting. Negative for anal bleeding, blood in stool, constipation and diarrhea.  Genitourinary: Negative for dysuria, vaginal bleeding and vaginal discharge.  Skin: Negative for rash.  Neurological: Negative for syncope.  All other systems reviewed and are negative.   Physical Exam Updated Vital Signs BP (!) 161/88 (BP Location: Right Arm)   Pulse 76   Temp 98.4 F (36.9 C) (Oral)   Resp 20   Ht 5\' 2"  (1.575 m)   Wt 109.8 kg   LMP 08/24/2019   SpO2 98%   BMI 44.26 kg/m   Physical Exam Vitals and nursing note reviewed.  Constitutional:      General: She is not in acute distress.    Appearance: She is well-developed. She is not toxic-appearing.  HENT:     Head: Normocephalic and atraumatic.  Eyes:     General:        Right eye: No discharge.        Left eye: No discharge.     Conjunctiva/sclera: Conjunctivae normal.  Cardiovascular:     Rate and Rhythm: Normal rate and regular rhythm.  Pulmonary:     Effort: Pulmonary effort is normal. No respiratory distress.     Breath sounds: Normal breath sounds. No wheezing, rhonchi or rales.  Abdominal:     General: There is no distension.     Palpations: Abdomen is soft.     Tenderness: There is abdominal tenderness in the right upper quadrant. There is no guarding. Positive signs include Murphy's sign.  Musculoskeletal:     Cervical back: Neck supple.  Skin:    General: Skin is warm and dry.     Findings: No rash.  Neurological:     Mental Status: She is alert.     Comments: Clear speech.   Psychiatric:        Behavior: Behavior normal.    ED Results / Procedures / Treatments   Labs (all labs ordered are listed, but only abnormal results are displayed) Labs Reviewed  COMPREHENSIVE METABOLIC PANEL - Abnormal; Notable for the following components:      Result Value   Glucose, Bld  118 (*)    Albumin 3.2 (*)    All other components within normal limits  CBC - Abnormal; Notable for the following components:   WBC 15.8 (*)    All other components within  normal limits  URINALYSIS, ROUTINE W REFLEX MICROSCOPIC - Abnormal; Notable for the following components:   Color, Urine AMBER (*)    APPearance CLOUDY (*)    Specific Gravity, Urine 1.038 (*)    Hgb urine dipstick MODERATE (*)    Protein, ur >=300 (*)    Bacteria, UA RARE (*)    All other components within normal limits  LIPASE, BLOOD  I-STAT BETA HCG BLOOD, ED ([facility], [facility], [facility] ONLY)    EKG EKG Interpretation  Date/Time:  Thursday September 23 2019 03:15:40 EDT Ventricular Rate:  74 PR Interval:  162 QRS Duration: 78 QT Interval:  368 QTC Calculation: 408 R Axis:   46 Text Interpretation: Normal sinus rhythm with sinus arrhythmia Normal ECG No STEMI Confirmed by Octaviano Glow [phone]) on 09/23/2019 7:21:29 AM   Radiology US Abdomen Limited RUQ  Result Date: 09/23/2019 CLINICAL DATA:  Epigastric pain for 6 hours EXAM: ULTRASOUND ABDOMEN LIMITED RIGHT UPPER QUADRANT COMPARISON:  04/27/2017 FINDINGS: Gallbladder: There are numerous echogenic, shadowing gallstones within the gallbladder lumen, largest measuring up to 1.5 cm in diameter. Diffuse gallbladder wall thickening measuring 0.4 cm. Small amount of pericholecystic fluid/edema. No sonographic Murphy sign noted by sonographer. Common bile duct: Diameter: 4 mm. Liver: No focal lesion identified. Within normal limits in parenchymal echogenicity. Portal vein is patent on color Doppler imaging with normal direction of blood flow towards the liver. Other: None. IMPRESSION: Cholelithiasis with gallbladder wall thickening and pericholecystic fluid which is suspicious for acute cholecystitis. Electronically Signed   By: Davina Poke D.O.   On: 09/23/2019 08:21    Procedures Procedures (including critical care time)  Medications Ordered in ED Medications  sodium  chloride flush (NS) 0.9 % injection 3 mL (has no administration in time range)    ED Course  I have reviewed the triage vital signs and the nursing notes.  Pertinent labs & imaging results that were available during my care of the patient were reviewed by me and considered in my medical decision making (see chart for details).    Julea Hutto was evaluated in Emergency Department on 09/23/2019 for the symptoms described in the history of present illness. He/she was evaluated in the context of the global COVID-19 pandemic, which necessitated consideration that the patient might be at risk for infection with the SARS-CoV-2 virus that causes COVID-19. Institutional protocols and algorithms that pertain to the evaluation of patients at risk for COVID-19 are in a state of rapid change based on information released by regulatory bodies including the CDC and federal and state organizations. These policies and algorithms were followed during the patient's care in the ED.  MDM Rules/Calculators/A&P                         Patient presents to the ED with complaints of abdominal pain.  Patient nontoxic, resting comfortably, vitals WNL with the exception of elevated BP- low suspicion for HTN emergency. Patient reports no pain on my initial assessment, however she does have tenderness to the RUQ with Positive murphys.  DDX: cholecystitis, cholelithiasis, cholangitis, choledocholithiasis, pancreatitis, GERD, PUD, perf, appendicitis, ACS, PE.    Additional history obtained:  Additional history obtained from chart & nursing note review. EKG: No STEMI Lab Tests:  I reviewed & interpreted labs, which included:  CBC: Leukocytosis @ 15.8. No anemia.  CMP: Mild hyperglycemia & hypoalbuminemia, otherwise unremarkable.  Lipase: WNL UA: Blood present, not overly consistent with UTI.   Imaging Studies ordered:  RUQ [place] ordered prior to my assessment, I independently visualized and interpreted imaging which showed  Cholelithiasis with gallbladder wall thickening and pericholecystic fluid which is suspicious for acute cholecystitis.  Patient without reports of pain currently however given her leukocytosis, tenderness with palpation, and findings suspicious for acute cholecystitis on RUQ [place] will start rocephin & discuss w/ general surgery.   Discussed w/ general surgery- will evaluate patient.  Patient admitted to general surgery service per chart review.  Findings and plan of care discussed with supervising physician Dr. Sedonia Small who is in agreement.   Portions of this note were generated with L[employer]. Dictation errors may occur despite best attempts at proofreading.  Final Clinical Impression(s) / ED Diagnoses Final diagnoses:  Biliary colic symptom  Acute cholecystitis    Rx / DC Orders ED Discharge Orders    None       Amaryllis Dyke, PA-C 09/23/19 1201    Maudie Flakes, MD 09/23/19 1745

## 2019-09-23 NOTE — Anesthesia Procedure Notes (Signed)
Procedure Name: Intubation Date/Time: 09/23/2019 2:21 PM Performed by: Inda Coke, CRNA Pre-anesthesia Checklist: Patient identified, Emergency Drugs available, Suction available and Patient being monitored Patient Re-evaluated:Patient Re-evaluated prior to induction Oxygen Delivery Method: Circle System Utilized Preoxygenation: Pre-oxygenation with 100% oxygen Induction Type: IV induction Ventilation: Mask ventilation without difficulty Laryngoscope Size: Mac and 3 Grade View: Grade II Tube type: Oral Tube size: 7.0 mm Number of attempts: 1 Airway Equipment and Method: Stylet and Oral airway Placement Confirmation: ETT inserted through vocal cords under direct vision,  positive ETCO2 and breath sounds checked- equal and bilateral Secured at: 22 cm Tube secured with: Tape Dental Injury: Teeth and Oropharynx as per pre-operative assessment

## 2019-09-23 NOTE — Anesthesia Postprocedure Evaluation (Signed)
Anesthesia Post Note  Patient: Sherri Blanchard  Procedure(s) Performed: LAPAROSCOPIC CHOLECYSTECTOMY (N/A Abdomen)     Patient location during evaluation: PACU Anesthesia Type: General Level of consciousness: awake and alert Pain management: pain level controlled Vital Signs Assessment: post-procedure vital signs reviewed and stable Respiratory status: spontaneous breathing, nonlabored ventilation, respiratory function stable and patient connected to nasal cannula oxygen Cardiovascular status: blood pressure returned to baseline and stable Postop Assessment: no apparent nausea or vomiting Anesthetic complications: no   No complications documented.  Last Vitals:  Vitals:   09/23/19 1615 09/23/19 1623  BP:  (!) 141/76  Pulse: 90 92  Resp: (!) 22 (!) 21  Temp:    SpO2: 96% 95%    Last Pain:  Vitals:   09/23/19 1615  TempSrc:   PainSc: 10-Worst pain ever                 Chigozie Basaldua COKER

## 2019-09-23 NOTE — Op Note (Signed)
   Operative Note  Date: 09/23/2019  Procedure: laparoscopic cholecystectomy  Pre-op diagnosis: acute cholecystitis Post-op diagnosis: same  Indication and clinical history: The patient is a 39 y.o. year old female with acute cholecystitis  Surgeon: Jesusita Oka, MD Assistant: Ileene Rubens, Sandy  Anesthesiologist: Linna Caprice, MD Anesthesia: General  Findings:  Specimen: gallbladder EBL: <10cc Drains/Implants: none  Disposition: PACU - hemodynamically stable.  Description of procedure: The patient was positioned supine on the operating room table. Time-out was performed verifying correct patient, procedure, signature of informed consent, and administration of pre-operative antibiotics. General anesthetic induction and intubation were uneventful. The abdomen was prepped and draped in the usual sterile fashion. An infra-umbilical incision was made using an open technique using zero vicryl stay sutures on either side of the fascia and a 22mm Hassan port inserted. After establishing pneumoperitoneum, which the patient tolerated well, the abdominal cavity was inspected and no injury of any intra-abdominal structures was identified. Additional ports were placed under direct visualization and using local anesthetic: two 62mm ports in the right subcostal region and a 69mm port in the epigastric region. The patient was re-positioned to reverse Trendelenburg and right side up. Adhesiolysis was performed to expose the gallbladder, which was then retracted cephalad. The infundibulum was identified and retracted toward the right lower quadrant. The peritoneum was incised over the infundibulum and the triangle of Calot dissected to expose the critical view of safety. With clear identification and isolation of the cystic duct and cystic artery, the cystic artery was doubly clipped and divided. After this, the cystic duct was identified as a single structure entering the gallbladder, and was also doubly clipped and  divided. The gallbladder was dissected off the liver bed using electrocautery and hemostasis of the liver bed was confirmed prior to separation of the final peritoneal attachments of the gallbladder to the liver bed. The gallbladder fossa was irrigated and fluid returned clear. After transection of the final peritoneal attachments, the gallbladder was placed in an endoscopic specimen retrieval bag, removed via the umbilical port site, and sent to pathology as a frozen specimen. The gallbladder fossa was inspected confirming hemostasis, the absence of bile leakage from the cystic duct stump, and correct placement of clips on the cystic artery and cystic duct stumps. The abdomen was desufflated and the fascia of the umbilical port site was closed using the previously placed stay sutures. Additional local anesthetic was administered at the umbilical port site.  The skin of all incisions was closed with 4-0 monocryl. Sterile dressings were applied. All sponge and instrument counts were correct at the conclusion of the procedure. The patient was awakened from anesthesia, extubated uneventfully, and transported to the PACU - hemodynamically stable.. There were no complications.    Jesusita Oka, MD General and [facility]

## 2019-09-23 NOTE — ED Triage Notes (Signed)
The  Pt is c/o epigastric pain for one hour  She thinks that she is having a gb attack  She had a similar episode approx 2 weeks ago  She has had nausea vomiting and diarrhea tonight lmp last month

## 2019-09-24 ENCOUNTER — Encounter (HOSPITAL_COMMUNITY): Payer: Self-pay | Admitting: Surgery

## 2019-09-24 ENCOUNTER — Encounter (HOSPITAL_COMMUNITY): Payer: Self-pay

## 2019-09-24 LAB — SURGICAL PATHOLOGY

## 2019-09-24 MED ORDER — ONDANSETRON 4 MG PO TBDP: 4.0000 mg | ORAL_TABLET | Freq: Four times a day (QID) | ORAL | 0 refills | Status: DC | PRN

## 2019-09-24 MED ORDER — OXYCODONE HCL 5 MG PO TABS: 5.0000 mg | ORAL_TABLET | Freq: Four times a day (QID) | ORAL | 0 refills | Status: DC | PRN

## 2019-09-24 MED ORDER — ACETAMINOPHEN 325 MG PO TABS: 650.0000 mg | ORAL_TABLET | Freq: Four times a day (QID) | ORAL | Status: DC | PRN

## 2019-09-24 NOTE — Discharge Instructions (Signed)
[facility] LAPAROSCOPIC SURGERY: POST OP INSTRUCTIONS Always review your discharge instruction sheet given to you by the facility where your surgery was performed. IF YOU HAVE DISABILITY OR FAMILY LEAVE FORMS, YOU MUST BRING THEM TO THE OFFICE FOR PROCESSING.   DO NOT GIVE THEM TO YOUR DOCTOR.  PAIN CONTROL  1. First take acetaminophen (Tylenol) AND/or ibuprofen (Advil) to control your pain after surgery.  Follow directions on package.  Taking acetaminophen (Tylenol) and/or ibuprofen (Advil) regularly after surgery will help to control your pain and lower the amount of prescription pain medication you may need.  You should not take more than 3,000 mg (3 grams) of acetaminophen (Tylenol) in 24 hours.  You should not take ibuprofen (Advil), aleve, motrin, naprosyn or other NSAIDS if you have a history of stomach ulcers or chronic kidney disease.  2. A prescription for pain medication may be given to you upon discharge.  Take your pain medication as prescribed, if you still have uncontrolled pain after taking acetaminophen (Tylenol) or ibuprofen (Advil). 3. Use ice packs to help control pain. 4. If you need a refill on your pain medication, please contact your pharmacy.  They will contact our office to request authorization. Prescriptions will not be filled after 5pm or on week-ends.  HOME MEDICATIONS 5. Take your usually prescribed medications unless otherwise directed.  DIET 6. You should follow a light diet the first few days after arrival home.  Be sure to include lots of fluids daily. Avoid fatty, fried foods.   CONSTIPATION 7. It is common to experience some constipation after surgery and if you are taking pain medication.  Increasing fluid intake and taking a stool softener (such as Colace) will usually help or prevent this problem from occurring.  A mild laxative (Milk of Magnesia or Miralax) should be taken according to package instructions if there are no bowel  movements after 48 hours.  WOUND/INCISION CARE 8. Most patients will experience some swelling and bruising in the area of the incisions.  Ice packs will help.  Swelling and bruising can take several days to resolve.  9. Unless discharge instructions indicate otherwise, follow guidelines below  a. STERI-STRIPS - you may remove your outer bandages 48 hours after surgery, and you may shower at that time.  You have steri-strips (small skin tapes) in place directly over the incision.  These strips should be left on the skin for 7-10 days.   b. DERMABOND/SKIN GLUE - you may shower in 24 hours.  The glue will flake off over the next 2-3 weeks. 10. Any sutures or staples will be removed at the office during your follow-up visit.  ACTIVITIES 11. You may resume regular (light) daily activities beginning the next day--such as daily self-care, walking, climbing stairs--gradually increasing activities as tolerated.  You may have sexual intercourse when it is comfortable.  Refrain from any heavy lifting or straining until approved by your doctor. a. You may drive when you are no longer taking prescription pain medication, you can comfortably wear a seatbelt, and you can safely maneuver your car and apply brakes.  FOLLOW-UP 12. You should see your doctor in the office for a follow-up appointment approximately 2-3 weeks after your surgery.  You should have been given your post-op/follow-up appointment when your surgery was scheduled.  If you did not receive a post-op/follow-up appointment, make sure that you call for this appointment within a day or two after you arrive home to insure a convenient appointment time.     WHEN TO CALL YOUR DOCTOR: 1. Fever over 101.0 2. Inability to urinate 3. Continued bleeding from incision. 4. Increased pain, redness, or drainage from the incision. 5. Increasing abdominal pain  The clinic staff is available to answer your questions during regular business hours.  Please don't  hesitate to call and ask to speak to one of the nurses for clinical concerns.  If you have a medical emergency, go to the nearest emergency room or call 911.  A surgeon from [facility] is always on call at the hospital. [address], [address], [place], [place]  [address] ? [address] [address], [place],    [address] [phone] ? [phone] ? FAX [phone] Web site: [email or web address]  .........   Managing Your Pain After Surgery Without Opioids    Thank you for participating in our program to help patients manage their pain after surgery without opioids. This is part of our effort to provide you with the best care possible, without exposing you or your family to the risk that opioids pose.  What pain can I expect after surgery? You can expect to have some pain after surgery. This is normal. The pain is typically worse the day after surgery, and quickly begins to get better. Many studies have found that many patients are able to manage their pain after surgery with Over-the-Counter (OTC) medications such as Tylenol and Motrin. If you have a condition that does not allow you to take Tylenol or Motrin, notify your surgical team.  How will I manage my pain? The best strategy for controlling your pain after surgery is around the clock pain control with Tylenol (acetaminophen) and Motrin (ibuprofen or Advil). Alternating these medications with each other allows you to maximize your pain control. In addition to Tylenol and Motrin, you can use heating pads or ice packs on your incisions to help reduce your pain.  How will I alternate your regular strength over-the-counter pain medication? You will take a dose of pain medication every three hours. ; Start by taking 650 mg of Tylenol (2 pills of 325 mg) ; 3 hours later take 600 mg of Motrin (3 pills of 200 mg) ; 3 hours after taking the Motrin take 650 mg of Tylenol ; 3 hours after that take 600 mg of  Motrin.   - 1 -  See example - if your first dose of Tylenol is at 12:00 PM   12:00 PM Tylenol 650 mg (2 pills of 325 mg)  3:00 PM Motrin 600 mg (3 pills of 200 mg)  6:00 PM Tylenol 650 mg (2 pills of 325 mg)  9:00 PM Motrin 600 mg (3 pills of 200 mg)  Continue alternating every 3 hours   We recommend that you follow this schedule around-the-clock for at least 3 days after surgery, or until you feel that it is no longer needed. Use the table on the last page of this handout to keep track of the medications you are taking. Important: Do not take more than 3000mg of Tylenol or 3200mg of Motrin in a 24-hour period. Do not take ibuprofen/Motrin if you have a history of bleeding stomach ulcers, severe kidney disease, &/or actively taking a blood thinner  What if I still have pain? If you have pain that is not controlled with the over-the-counter pain medications (Tylenol and Motrin or Advil) you might have what we call "breakthrough" pain. You will receive a prescription for a small amount of an opioid pain medication such as   Oxycodone, Tramadol, or Tylenol with Codeine. Use these opioid pills in the first 24 hours after surgery if you have breakthrough pain. Do not take more than 1 pill every 4-6 hours.  If you still have uncontrolled pain after using all opioid pills, don't hesitate to call our staff using the number provided. We will help make sure you are managing your pain in the best way possible, and if necessary, we can provide a prescription for additional pain medication.   Day 1    Time  Name of Medication Number of pills taken  Amount of Acetaminophen  Pain Level   Comments  AM PM       AM PM       AM PM       AM PM       AM PM       AM PM       AM PM       AM PM       Total Daily amount of Acetaminophen Do not take more than  3,000 mg per day      Day 2    Time  Name of Medication Number of pills taken  Amount of Acetaminophen  Pain Level   Comments  AM  PM       AM PM       AM PM       AM PM       AM PM       AM PM       AM PM       AM PM       Total Daily amount of Acetaminophen Do not take more than  3,000 mg per day      Day 3    Time  Name of Medication Number of pills taken  Amount of Acetaminophen  Pain Level   Comments  AM PM       AM PM       AM PM       AM PM          AM PM       AM PM       AM PM       AM PM       Total Daily amount of Acetaminophen Do not take more than  3,000 mg per day      Day 4    Time  Name of Medication Number of pills taken  Amount of Acetaminophen  Pain Level   Comments  AM PM       AM PM       AM PM       AM PM       AM PM       AM PM       AM PM       AM PM       Total Daily amount of Acetaminophen Do not take more than  3,000 mg per day      Day 5    Time  Name of Medication Number of pills taken  Amount of Acetaminophen  Pain Level   Comments  AM PM       AM PM       AM PM       AM PM       AM PM       AM PM       AM PM         AM PM       Total Daily amount of Acetaminophen Do not take more than  3,000 mg per day       Day 6    Time  Name of Medication Number of pills taken  Amount of Acetaminophen  Pain Level  Comments  AM PM       AM PM       AM PM       AM PM       AM PM       AM PM       AM PM       AM PM       Total Daily amount of Acetaminophen Do not take more than  3,000 mg per day      Day 7    Time  Name of Medication Number of pills taken  Amount of Acetaminophen  Pain Level   Comments  AM PM       AM PM       AM PM       AM PM       AM PM       AM PM       AM PM       AM PM       Total Daily amount of Acetaminophen Do not take more than  3,000 mg per day        For additional information about how and where to safely dispose of unused opioid medications - https://www.morepowerfulnc.org  Disclaimer: This document contains information and/or instructional materials adapted from [facility]  for the typical patient with your condition. It does not replace medical advice from your health care provider because your experience may differ from that of the typical patient. Talk to your health care provider if you have any questions about this document, your condition or your treatment plan. Adapted from [facility]  

## 2019-09-24 NOTE — Discharge Summary (Signed)
[facility] Discharge Summary   Patient ID: Sherri Blanchard MRN: [id number] DOB/AGE: May 21, 1980 39 y.o.  Admit date: 09/23/2019 Discharge date: 09/24/2019  Admitting Diagnosis: Acute cholecystitis  Discharge Diagnosis Acute cholecystitis  Consultants None  Imaging: US Abdomen Limited RUQ  Result Date: 09/23/2019 CLINICAL DATA:  Epigastric pain for 6 hours EXAM: ULTRASOUND ABDOMEN LIMITED RIGHT UPPER QUADRANT COMPARISON:  04/27/2017 FINDINGS: Gallbladder: There are numerous echogenic, shadowing gallstones within the gallbladder lumen, largest measuring up to 1.5 cm in diameter. Diffuse gallbladder wall thickening measuring 0.4 cm. Small amount of pericholecystic fluid/edema. No sonographic Murphy sign noted by sonographer. Common bile duct: Diameter: 4 mm. Liver: No focal lesion identified. Within normal limits in parenchymal echogenicity. Portal vein is patent on color Doppler imaging with normal direction of blood flow towards the liver. Other: None. IMPRESSION: Cholelithiasis with gallbladder wall thickening and pericholecystic fluid which is suspicious for acute cholecystitis. Electronically Signed   By: Davina Poke D.O.   On: 09/23/2019 08:21    Procedures Dr. Bobbye Morton (09/23/19) - Laparoscopic Cholecystectomy   Hospital Course:  Patient is a 39 year old female who presented to [facility] with abdominal pain.  Workup showed acute cholecystitis.  Patient was admitted and underwent procedure listed above.  Tolerated procedure well and was transferred to the floor.  Diet was advanced as tolerated.  On POD#1, the patient was voiding well, tolerating diet, ambulating well, pain well controlled, vital signs stable, incisions c/d/i and felt stable for discharge home.  Patient will follow up in our office in 3 weeks and knows to call with questions or concerns.  She will call to confirm appointment date/time.    Physical Exam: General:  Alert, NAD, pleasant, comfortable Abd:  Soft,  ND, mild tenderness, incisions C/D/I   I or a member of my team have reviewed this patient in the Controlled Substance Database.   Allergies as of 09/24/2019      Reactions   Amoxicillin-pot Clavulanate Other (See Comments)   Yeast infection  Yeast infection per pt      Medication List    TAKE these medications   acetaminophen 325 MG tablet Commonly known as: TYLENOL Take 2 tablets (650 mg total) by mouth every 6 (six) hours as needed for mild pain (or temp > 100).   albuterol 108 (90 Base) MCG/ACT inhaler Commonly known as: VENTOLIN HFA Inhale 2 puffs into the lungs every 6 (six) hours as needed for wheezing or shortness of breath.   aspirin 81 MG chewable tablet Chew 81 mg by mouth daily.   cetirizine 10 MG tablet Commonly known as: ZYRTEC Take 10 mg by mouth daily.   famotidine 20 MG tablet Commonly known as: PEPCID Take 20 mg by mouth as needed for heartburn or indigestion.   hydrochlorothiazide 25 MG tablet Commonly known as: HYDRODIURIL Take 1 tablet (25 mg total) by mouth daily.   labetalol 200 MG tablet Commonly known as: NORMODYNE Take 1 tablet (200 mg total) by mouth 2 (two) times daily.   multivitamin tablet Take 1 tablet by mouth daily.   norethindrone-ethinyl estradiol 1/35 tablet Commonly known as: ORTHO-NOVUM Take 1 tablet by mouth daily.   ondansetron 4 MG disintegrating tablet Commonly known as: ZOFRAN-ODT Take 1 tablet (4 mg total) by mouth every 6 (six) hours as needed for nausea.   oxyCODONE 5 MG immediate release tablet Commonly known as: Oxy IR/ROXICODONE Take 1-2 tablets (5-10 mg total) by mouth every 6 (six) hours as needed for moderate pain or severe pain.  triamcinolone cream 0.1 % Commonly known as: KENALOG Apply 1 application topically 2 (two) times daily as needed.         Follow-up [facility], [place]. Call on 10/12/2019.   Specialty: General Surgery Why: Your appointment is 08/03 at 9 am Please  arrive 30 minutes prior to your appointment to check in and fill out paperwork. Bring photo ID and insurance information. Contact information: [address] [place] [phone]              Signed: Norm Parcel , [facility] 09/24/2019, 7:58 AM Please see Amion for pager number during day hours 7:00am-4:30pm

## 2019-09-24 NOTE — Progress Notes (Signed)
Patient discharged to home. Verbalizes understanding of all discharge instructions including incision care, discharge medications, and follow up MD visits.

## 2019-09-28 ENCOUNTER — Telehealth: Payer: Self-pay

## 2019-09-28 NOTE — Telephone Encounter (Signed)
Per Dr Silvio Pate: Please call to check and see how she is doing after her gallbladder removal. She went home so fast I didn't have time to call her.  Left message on cell to see how she is doing. Asked that she call the office or send a MyChart on her progress.

## 2019-09-30 NOTE — Telephone Encounter (Signed)
Spoke to pt. She said she is feeling fine.

## 2019-10-27 ENCOUNTER — Other Ambulatory Visit: Payer: Self-pay | Admitting: Internal Medicine

## 2019-10-27 DIAGNOSIS — Z1231 Encounter for screening mammogram for malignant neoplasm of breast: Secondary | ICD-10-CM

## 2019-10-29 ENCOUNTER — Telehealth: Payer: Self-pay

## 2019-10-29 NOTE — Telephone Encounter (Signed)
Okay to increase the labetalol to 400mg  bid (have her take 200/400 for a week, then up to 400mg  bid. Have her let me know if there are any problems Stop the HCTZ

## 2019-10-29 NOTE — Telephone Encounter (Addendum)
She is still trying to get pregnant. Would like to stay away from HCTZ for that reason. He last period was 09-26-19. Waiting 2 more weeks to do a pregnancy test.

## 2019-10-29 NOTE — Telephone Encounter (Signed)
Message from pt stating requesting an increase in strength of labetolol so she will only have to take one HTN med.   States she is taking labetalol and HCTZ.  Plz advise.

## 2019-10-29 NOTE — Telephone Encounter (Signed)
While we could increase the labetalol, I think she will get a better blood pressure response from the HCTZ in addition to the moderate labetalol dose. Does she have some concern about the HCTZ? (certainly shouldn't be price)

## 2019-11-01 MED ORDER — LABETALOL HCL 200 MG PO TABS: 200.0000 mg | ORAL_TABLET | Freq: Two times a day (BID) | ORAL | 11 refills | Status: DC

## 2019-11-01 NOTE — Addendum Note (Signed)
Addended by: Pilar Grammes on: 11/01/2019 02:47 PM   Modules accepted: Orders

## 2019-11-01 NOTE — Telephone Encounter (Signed)
Spoke to pt. She said since we last talked, she has been doing labetalol bid and her BP has been in the 130s/80s. She is going to continue taking that way for now and if her numbers go up, she will call and we will increase the rx.

## 2019-11-06 ENCOUNTER — Other Ambulatory Visit: Payer: Self-pay | Admitting: Internal Medicine

## 2019-11-22 ENCOUNTER — Telehealth: Payer: Self-pay

## 2019-11-22 MED ORDER — LABETALOL HCL 200 MG PO TABS: 400.0000 mg | ORAL_TABLET | Freq: Two times a day (BID) | ORAL | 11 refills | Status: DC

## 2019-11-22 NOTE — Telephone Encounter (Signed)
Pt stated its the same the amount she takes changed.  The old rx was 1 in am and 1 in pm.  Pt is taking 2 in am and 2 in pm  This works better for her blood pressure  She will be out of meds tomorrow  [employer] [address]  Best number [phone]

## 2019-11-22 NOTE — Telephone Encounter (Signed)
Let me approve with Dr Silvio Pate and I will send it in.

## 2019-11-22 NOTE — Telephone Encounter (Signed)
Patient left a VM on the triage line stating that she has been taking more of her Labetalol, and she needs a refill. I left a message for patient to return phone call - as I need to obtain more information regarding what dose she is taking, to ensure refill is sent in correctly. Will await return phone call.

## 2019-11-22 NOTE — Telephone Encounter (Signed)
Okay for 200mg  2 tabs bid 1 year Rx is fine

## 2019-11-22 NOTE — Addendum Note (Signed)
Addended by: Pilar Grammes on: 11/22/2019 05:19 PM   Modules accepted: Orders

## 2019-11-22 NOTE — Telephone Encounter (Signed)
Duplicate enc.

## 2020-01-17 ENCOUNTER — Other Ambulatory Visit: Payer: Self-pay | Admitting: Internal Medicine

## 2020-01-25 ENCOUNTER — Ambulatory Visit (INDEPENDENT_AMBULATORY_CARE_PROVIDER_SITE_OTHER): Payer: 59 | Admitting: Internal Medicine

## 2020-01-25 ENCOUNTER — Encounter: Payer: Self-pay | Admitting: Internal Medicine

## 2020-01-25 ENCOUNTER — Other Ambulatory Visit: Payer: Self-pay

## 2020-01-25 VITALS — BP 124/78 | HR 78 | Temp 97.7°F | Ht 62.0 in | Wt 237.0 lb

## 2020-01-25 DIAGNOSIS — Z0000 Encounter for general adult medical examination without abnormal findings: Secondary | ICD-10-CM

## 2020-01-25 DIAGNOSIS — I10 Essential (primary) hypertension: Secondary | ICD-10-CM

## 2020-01-25 DIAGNOSIS — K580 Irritable bowel syndrome with diarrhea: Secondary | ICD-10-CM

## 2020-01-25 DIAGNOSIS — J4520 Mild intermittent asthma, uncomplicated: Secondary | ICD-10-CM | POA: Diagnosis not present

## 2020-01-25 DIAGNOSIS — Z23 Encounter for immunization: Secondary | ICD-10-CM

## 2020-01-25 LAB — CBC
HCT: 41 % (ref 36.0–46.0)
Hemoglobin: 13.8 g/dL (ref 12.0–15.0)
MCHC: 33.6 g/dL (ref 30.0–36.0)
MCV: 90.1 fl (ref 78.0–100.0)
Platelets: 301 10*3/uL (ref 150.0–400.0)
RBC: 4.55 Mil/uL (ref 3.87–5.11)
RDW: 13.7 % (ref 11.5–15.5)
WBC: 9.6 10*3/uL (ref 4.0–10.5)

## 2020-01-25 LAB — COMPREHENSIVE METABOLIC PANEL
ALT: 21 U/L (ref 0–35)
AST: 21 U/L (ref 0–37)
Albumin: 4.1 g/dL (ref 3.5–5.2)
Alkaline Phosphatase: 87 U/L (ref 39–117)
BUN: 9 mg/dL (ref 6–23)
CO2: 29 mEq/L (ref 19–32)
Calcium: 9.2 mg/dL (ref 8.4–10.5)
Chloride: 102 mEq/L (ref 96–112)
Creatinine, Ser: 0.78 mg/dL (ref 0.40–1.20)
GFR: 94.44 mL/min (ref >60.00)
Glucose, Bld: 85 mg/dL (ref 70–99)
Potassium: 4 mEq/L (ref 3.5–5.1)
Sodium: 136 mEq/L (ref 135–145)
Total Bilirubin: 0.5 mg/dL (ref 0.2–1.2)
Total Protein: 7 g/dL (ref 6.0–8.3)

## 2020-01-25 LAB — LIPID PANEL
Cholesterol: 182 mg/dL (ref 0–200)
HDL: 40.6 mg/dL (ref >39.00)
LDL Cholesterol: 108 mg/dL — ABNORMAL HIGH (ref 0–99)
NonHDL: 141.39
Total CHOL/HDL Ratio: 4
Triglycerides: 166 mg/dL — ABNORMAL HIGH (ref 0.0–149.0)
VLDL: 33.2 mg/dL (ref 0.0–40.0)

## 2020-01-25 LAB — T4, FREE: Free T4: 0.88 ng/dL (ref 0.60–1.60)

## 2020-01-25 MED ORDER — KETOCONAZOLE 2 % EX CREA: 1.0000 "application " | TOPICAL_CREAM | Freq: Every day | CUTANEOUS | 1 refills | Status: DC | PRN

## 2020-01-25 NOTE — Assessment & Plan Note (Signed)
BP Readings from Last 3 Encounters:  01/25/20 124/78  09/24/19 (!) 149/81  05/28/19 138/78   Good on the labetalol alone

## 2020-01-25 NOTE — Progress Notes (Signed)
Subjective:    Patient ID: Sherri Blanchard, female    DOB: 01/24/1981, 39 y.o.   MRN: [id number]  HPI Here for physical This visit occurred during the SARS-CoV-2 public health emergency.  Safety protocols were in place, including screening questions prior to the visit, additional usage of staff PPE, and extensive cleaning of exam room while observing appropriate contact time as indicated for disinfecting solutions.   Has recovered from the cholecystectomy  Now off the HCTZ Just on the labetalol Checks BP at work at times---usually 130/80 on average  Got COVID booster  Not really exercising---discussed Still smoking at times---cigarettes (will have 4 cigarettes four times a week) Uses for stress relief----discussed using nicotine lozenges/gum instead  Current Outpatient Medications on File Prior to Visit  Medication Sig Dispense Refill  . albuterol (VENTOLIN HFA) 108 (90 Base) MCG/ACT inhaler Inhale 2 puffs into the lungs every 6 (six) hours as needed for wheezing or shortness of breath. 18 g 1  . aspirin 81 MG chewable tablet Chew 81 mg by mouth daily.    . cetirizine (ZYRTEC) 10 MG tablet Take 10 mg by mouth daily.  11  . famotidine (PEPCID) 20 MG tablet Take 20 mg by mouth as needed for heartburn or indigestion.    Marland Kitchen labetalol (NORMODYNE) 200 MG tablet Take 2 tablets (400 mg total) by mouth 2 (two) times daily. 120 tablet 11  . Multiple Vitamin (MULTIVITAMIN) tablet Take 1 tablet by mouth daily.    Marland Kitchen triamcinolone cream (KENALOG) 0.1 % Apply 1 application topically 2 (two) times daily as needed. 45 g 1   No current facility-administered medications on file prior to visit.    Allergies  Allergen Reactions  . Amoxicillin-Pot Clavulanate Other (See Comments)    Yeast infection  Yeast infection per pt    Past Medical History:  Diagnosis Date  . Allergic rhinitis due to pollen   . Hypertension   . Infertility, female 39    Past Surgical History:  Procedure Laterality  Date  . CESAREAN SECTION  2003  . CHOLECYSTECTOMY N/A 09/23/2019   Procedure: LAPAROSCOPIC CHOLECYSTECTOMY;  Surgeon: Jesusita Oka, MD;  Location: [facility] OR;  Service: General;  Laterality: N/A;    Family History  Problem Relation Age of Onset  . Hypertension Mother   . Heart disease Mother        had MI  . Heart attack Mother 72       April 2019  . Diabetes Father   . Hypertension Father   . Cancer Father        PROSTATE  . Hyperthyroidism Father 65       just DX April 2020  . Breast cancer Maternal Grandmother   . Cancer Maternal Grandmother        BONE  . Cancer Maternal Grandfather        LUNG  . Diabetes Paternal Grandmother     Social History   Socioeconomic History  . Marital status: Divorced    Spouse name: Not on file  . Number of children: 1  . Years of education: Not on file  . Highest education level: Not on file  Occupational History  . Occupation: Ship broker: [employer]  Tobacco Use  . Smoking status: Current Some Day Smoker    Packs/day: 0.10  . Smokeless tobacco: Never Used  Vaping Use  . Vaping Use: Never used  Substance and Sexual Activity  . Alcohol use: Yes  Comment: OCC  . Drug use: No  . Sexual activity: Yes  Other Topics Concern  . Not on file  Social History Narrative   With stable partner since 2011   Social Determinants of Health   Financial Resource Strain:   . Difficulty of Paying Living Expenses: Not on file  Food Insecurity:   . Worried About Charity fundraiser in the Last Year: Not on file  . Ran Out of Food in the Last Year: Not on file  Transportation Needs:   . Lack of Transportation (Medical): Not on file  . Lack of Transportation (Non-Medical): Not on file  Physical Activity:   . Days of Exercise per Week: Not on file  . Minutes of Exercise per Session: Not on file  Stress:   . Feeling of Stress : Not on file  Social Connections:   . Frequency of Communication with Friends and Family:  Not on file  . Frequency of Social Gatherings with Friends and Family: Not on file  . Attends Religious Services: Not on file  . Active Member of Clubs or Organizations: Not on file  . Attends Archivist Meetings: Not on file  . Marital Status: Not on file  Intimate Partner Violence:   . Fear of Current or Ex-Partner: Not on file  . Emotionally Abused: Not on file  . Physically Abused: Not on file  . Sexually Abused: Not on file   Review of Systems  Constitutional: Negative for fatigue.       Has lost 10# in past year Tries to be careful with eating (dropped sodas) Wears seat belt  HENT: Positive for tinnitus. Negative for dental problem and hearing loss.   Eyes: Negative for visual disturbance.       Having close up vision issues  Respiratory: Negative for cough and chest tightness.        Uses inhaler rarely  Cardiovascular: Negative for chest pain and palpitations.       Had swollen feet while on cruise----better now  Gastrointestinal: Positive for diarrhea.       Still gets cramping then loose stools Imodium before going out helps  Endocrine: Negative for polydipsia and polyuria.  Genitourinary: Positive for frequency. Negative for dysuria and hematuria.       Keeps up with Dr Amalia Hailey Periods are still not regular even on OCP (skips months) Last 5 days when occur Some dyspareunia---not consistent (inside)  Musculoskeletal: Negative for back pain and joint swelling.       Typical achy feelings  Skin:       Itching and peeling on bottoms of feet Vaseline/TAC works  Allergic/Immunologic: Positive for environmental allergies. Negative for immunocompromised state.       Cetirizine is effective  Neurological: Positive for dizziness. Negative for syncope and headaches.       Vertigo at times---if lying down and turns to the right Has used Epley maneuver in the past Has meclizine  Hematological: Negative for adenopathy. Does not bruise/bleed easily.    Psychiatric/Behavioral: Negative for dysphoric mood and sleep disturbance. The patient is not nervous/anxious.        Objective:   Physical Exam Constitutional:      Appearance: Normal appearance. She is obese.  HENT:     Right Ear: Tympanic membrane, ear canal and external ear normal.     Left Ear: Tympanic membrane, ear canal and external ear normal.     Mouth/Throat:     Pharynx: No oropharyngeal exudate or posterior  oropharyngeal erythema.  Eyes:     Conjunctiva/sclera: Conjunctivae normal.     Pupils: Pupils are equal, round, and reactive to light.  Cardiovascular:     Rate and Rhythm: Normal rate and regular rhythm.     Pulses: Normal pulses.     Heart sounds: No murmur heard.  No gallop.   Pulmonary:     Effort: Pulmonary effort is normal.     Breath sounds: Normal breath sounds. No wheezing or rales.  Abdominal:     Palpations: Abdomen is soft.     Tenderness: There is no abdominal tenderness.  Musculoskeletal:     Cervical back: Neck supple.     Right lower leg: No edema.     Left lower leg: No edema.  Lymphadenopathy:     Cervical: No cervical adenopathy.  Skin:    Comments: Mild scaling on plantar feet--and slightly between toes on right (will try ketoconazole)  Neurological:     General: No focal deficit present.     Mental Status: She is alert and oriented to person, place, and time.  Psychiatric:        Mood and Affect: Mood normal.        Behavior: Behavior normal.            Assessment & Plan:

## 2020-01-25 NOTE — Assessment & Plan Note (Signed)
Imodium prn 

## 2020-01-25 NOTE — Assessment & Plan Note (Signed)
Discussed fitness Try nicotine lozenges instead of cigarettes Does get mammograms Colon cancer screening at 43 Had COVID booster Flu vaccine today

## 2020-01-25 NOTE — Assessment & Plan Note (Signed)
Rarely needs the inhaler

## 2020-02-21 ENCOUNTER — Other Ambulatory Visit: Payer: Self-pay

## 2020-02-21 ENCOUNTER — Ambulatory Visit
Admission: RE | Admit: 2020-02-21 | Discharge: 2020-02-21 | Disposition: A | Discharge status: Home / Self Care | Payer: 59 | Source: Ambulatory Visit | Attending: Internal Medicine | Admitting: Internal Medicine

## 2020-02-21 DIAGNOSIS — Z1231 Encounter for screening mammogram for malignant neoplasm of breast: Secondary | ICD-10-CM | POA: Insufficient documentation

## 2020-02-22 ENCOUNTER — Other Ambulatory Visit: Payer: Self-pay | Admitting: Internal Medicine

## 2020-02-22 DIAGNOSIS — R928 Other abnormal and inconclusive findings on diagnostic imaging of breast: Secondary | ICD-10-CM

## 2020-02-22 DIAGNOSIS — N6310 Unspecified lump in the right breast, unspecified quadrant: Secondary | ICD-10-CM

## 2020-03-07 ENCOUNTER — Other Ambulatory Visit: Payer: Self-pay

## 2020-03-07 ENCOUNTER — Ambulatory Visit
Admission: RE | Admit: 2020-03-07 | Discharge: 2020-03-07 | Disposition: A | Discharge status: Home / Self Care | Payer: 59 | Source: Ambulatory Visit | Attending: Internal Medicine | Admitting: Internal Medicine

## 2020-03-07 DIAGNOSIS — N6310 Unspecified lump in the right breast, unspecified quadrant: Secondary | ICD-10-CM | POA: Insufficient documentation

## 2020-03-07 DIAGNOSIS — R928 Other abnormal and inconclusive findings on diagnostic imaging of breast: Secondary | ICD-10-CM

## 2020-04-17 ENCOUNTER — Other Ambulatory Visit: Payer: Self-pay | Admitting: Internal Medicine

## 2020-06-14 ENCOUNTER — Telehealth: Payer: 59 | Admitting: Family Medicine

## 2020-06-14 ENCOUNTER — Telehealth: Payer: Self-pay

## 2020-06-14 NOTE — Telephone Encounter (Signed)
Morey Hummingbird said pt was advised to go to [facility] and pt was going to [place]. I called pt work # and was advised pt had already left [facility] to [facility]. Sending note to DR Silvio Pate as PCP and DR G.

## 2020-06-14 NOTE — Telephone Encounter (Signed)
[facility] - Client TELEPHONE ADVICE RECORD [facility] Patient Name: Sherri Blanchard Gender: Female DOB: 01/10/1981 Age: 40 Y 73 M 29 D Return Phone Number: [phone] (Primary) Address: City/ State/ Zip: [place] [address] Client [facility] - Client Client Site [facility] - Day Physician Viviana Simpler- MD Contact Type Call Who Is Calling Patient / Member / Family / Caregiver Call Type Triage / Clinical Relationship To Patient Self Return Phone Number [phone] (Primary) Chief Complaint BREATHING - shortness of breath or sounds breathless Reason for Call Symptomatic / Request for [place] states, patient having trouble breathing, cough, mucus, congestion. No taste nor smell. Cannot be seen at office. Was sent to [place] for triage. Translation No Nurse Assessment Nurse: Hammonds, RN, Lissa Date/Time (Eastern Time): 06/14/2020 8:20:19 AM Confirm and document reason for call. If symptomatic, describe symptoms. ---Caller states: Symptoms started on Saturday, and COVID test was negative. I usually get bronchititis every spring. I am getting SOB if I move alot, I am using my inhaler and it works for couple of hours. i have chest tightness. I am having a cough, mucus, congestion. No taste nor smell. O2 sat 98% Does the patient have any new or worsening symptoms? ---Yes Will a triage be completed? ---Yes Related visit to physician within the last 2 weeks? ---No Does the PT have any chronic conditions? (i.e. diabetes, asthma, this includes High risk factors for pregnancy, etc.) ---Yes List chronic conditions. ---HTN ASTHMA Is the patient pregnant or possibly pregnant? (Ask all females between the ages of 67-55) ---No Is this a behavioral health or substance abuse call? ---No Guidelines Guideline Title Affirmed Question Affirmed Notes Nurse Date/Time  (Eastern Time) Asthma Attack [1] MODERATE asthma attack (e.g., SOB at rest, speaks in phrases, audible Hammonds, RN, Lissa 06/14/2020 8:23:00 AM PLEASE NOTE: All timestamps contained within this report are represented as [place] Standard Time. CONFIDENTIALTY NOTICE: This fax transmission is intended only for the addressee. It contains information that is legally privileged, confidential or otherwise protected from use or disclosure. If you are not the intended recipient, you are strictly prohibited from reviewing, disclosing, copying using or disseminating any of this information or taking any action in reliance on or regarding this information. If you have received this fax in error, please notify us immediately by telephone so that we can arrange for its return to [place]. Phone: [phone], Toll-Free: [phone], Fax: [phone] Page: 2 of 2 Call Id: [id number] Guidelines Guideline Title Affirmed Question Affirmed Notes Nurse Date/Time Eilene Ghazi Time) wheezes) AND [2] not resolved after 2 inhaler or nebulizer treatments given 20 minutes apart Disp. Time Eilene Ghazi Time) Disposition Final User 06/14/2020 8:19:04 AM Send to Urgent Queue Jobie Quaker 06/14/2020 8:31:06 AM Go to ED Now (or PCP triage) Yes Hammonds, RN, Epifanio Lesches Caller Disagree/Comply Comply Caller Understands Yes PreDisposition Did not know what to do Care Advice Given Per Guideline GO TO ED NOW (OR PCP TRIAGE): * IF NO PCP (PRIMARY CARE PROVIDER) SECOND-LEVEL TRIAGE: You need to be seen within the next hour. Go to the [facility] at _____________ [place] as soon as you can. * If you have not used your inhaler or nebulizer in the PAST 20 MINUTES, take 2 to 4 puffs of your quick-relief ALBUTEROL (SALBUTAMOL) INHALER right now. BRING MEDICINES: CARE ADVICE given per Asthma Attack (Adult) guideline. Comments User: Moses Manners, RN Date/Time Eilene Ghazi Time): 06/14/2020 8:22:35 AM No fever. User: Moses Manners, RN  Date/Time (Eastern Time): 06/14/2020 8:31:02 AM Transferred caller to appointment line/office for scheduling, caller is aware to go to UC/ED if unable to get appointment within next hour. Referrals REFERRED TO PCP OFFICE Warm transfer to backline

## 2020-06-14 NOTE — Telephone Encounter (Signed)
Please check on her later or in the morning Sounds like this could be COVID

## 2020-06-15 NOTE — Telephone Encounter (Signed)
Spoke to pt. She did go to [facility]. Dx with Bronchitis. She is staying home and trying to recoup.

## 2020-08-21 ENCOUNTER — Telehealth: Payer: Self-pay | Admitting: Internal Medicine

## 2020-08-21 MED ORDER — NIRMATRELVIR/RITONAVIR (PAXLOVID)TABLET: 3.0000 | ORAL_TABLET | Freq: Two times a day (BID) | ORAL | 0 refills | Status: AC

## 2020-08-21 NOTE — Telephone Encounter (Signed)
Please let her know that I sent in the prescription for the antiviral medication. She will need an in person visit at urgent care or ER if she develops significant chest pain or trouble breathing.

## 2020-08-21 NOTE — Telephone Encounter (Signed)
Spoke to pt. She states she understands the message.

## 2020-08-21 NOTE — Telephone Encounter (Signed)
Patient tested positive today for Covid. Symptoms started Saturday 08/19/20 - sore throat, Productive cough, body aches and chills. No known fever.  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  [facility]:  [facility] [place] [place]

## 2020-08-30 ENCOUNTER — Encounter: Payer: Self-pay | Admitting: Family Medicine

## 2020-08-30 ENCOUNTER — Telehealth (INDEPENDENT_AMBULATORY_CARE_PROVIDER_SITE_OTHER): Payer: 59 | Admitting: Family Medicine

## 2020-08-30 ENCOUNTER — Telehealth: Payer: Self-pay | Admitting: Internal Medicine

## 2020-08-30 ENCOUNTER — Other Ambulatory Visit: Payer: Self-pay

## 2020-08-30 VITALS — Ht 62.0 in | Wt 236.0 lb

## 2020-08-30 DIAGNOSIS — J4520 Mild intermittent asthma, uncomplicated: Secondary | ICD-10-CM | POA: Diagnosis not present

## 2020-08-30 DIAGNOSIS — U071 COVID-19: Secondary | ICD-10-CM | POA: Insufficient documentation

## 2020-08-30 MED ORDER — HYDROCOD POLST-CPM POLST ER 10-8 MG/5ML PO SUER: 5.0000 mL | Freq: Two times a day (BID) | ORAL | 0 refills | Status: DC | PRN

## 2020-08-30 MED ORDER — PREDNISONE 20 MG PO TABS: ORAL_TABLET | ORAL | 0 refills | Status: DC

## 2020-08-30 NOTE — Assessment & Plan Note (Signed)
Using albuterol inhaler with benefit in setting of COVID infection. Add prednisone taper.

## 2020-08-30 NOTE — Progress Notes (Signed)
Patient ID: Sherri Blanchard, female    DOB: 01-07-1981, 40 y.o.   MRN: [id number]  Virtual visit completed through [place], a video enabled telemedicine application. Due to national recommendations of social distancing due to COVID-19, a virtual visit is felt to be most appropriate for this patient at this time. Reviewed limitations, risks, security and privacy concerns of performing a virtual visit and the availability of in person appointments. I also reviewed that there may be a patient responsible charge related to this service. The patient agreed to proceed.   Patient location: at work in private office  Provider location: Financial controller at [facility], office Persons participating in this virtual visit: patient, provider   If any vitals were documented, they were collected by patient at home unless specified below.    Ht 5\' 2"  (1.575 m)   Wt 236 lb (107 kg)   LMP 08/14/2020 (Exact Date) Comment: Pt has been bleeding for 2 weeks, no bleeding today started back on OC's  BMI 43.16 kg/m    CC: f/u COVID  Subjective:   HPI: Sherri Blanchard is a 40 y.o. female presenting on 08/30/2020 for Cough (Pt c/o cough and expectorating green/yellow sputum. She was treated for COVID last week. Cough is getting worse keeping her up at night.)   Patient with known HTN, IBS, lupus anticoagulant positive, mild intermittent asthma.   Tested positive for COVID on 08/21/2020. First day of symptoms was 08/19/2020. Treated with Paxlovid with benefit. Retested Sunday, negative. Unfortunately cough seems to be persisting and worsening. Coughing fits worse at night time, affecting ability to sleep. Productive cough of yellow mucous - becomes clear during the day. Wheezing with coughing spells.  No fevers, significant dyspnea, chest pain. Otherwise feels fine.   Tried fast max mucinex, cold/flu, as well as left over tussionex cough medicine with benefit. Using albuterol with benefit. No on controller medication for asthma.    Works at [employer] - currently back at work as she's completed 5d recommended isolation period.       Relevant past medical, surgical, family and social history reviewed and updated as indicated. Interim medical history since our last visit reviewed. Allergies and medications reviewed and updated. Outpatient Medications Prior to Visit  Medication Sig Dispense Refill   albuterol (VENTOLIN HFA) 108 (90 Base) MCG/ACT inhaler Inhale 2 puffs into the lungs every 6 (six) hours as needed for wheezing or shortness of breath. 18 g 1   aspirin 81 MG chewable tablet Chew 81 mg by mouth daily.     cetirizine (ZYRTEC) 10 MG tablet Take 10 mg by mouth daily.  11   famotidine (PEPCID) 20 MG tablet Take 20 mg by mouth as needed for heartburn or indigestion.     ketoconazole (NIZORAL) 2 % cream Apply 1 application topically daily as needed for irritation. 60 each 1   labetalol (NORMODYNE) 200 MG tablet Take 2 tablets (400 mg total) by mouth 2 (two) times daily. 120 tablet 11   Multiple Vitamin (MULTIVITAMIN) tablet Take 1 tablet by mouth daily.     triamcinolone cream (KENALOG) 0.1 % Apply 1 application topically 2 (two) times daily as needed. 45 g 1   NORTREL 1/35, 28, tablet Take 1 tablet by mouth daily.     No facility-administered medications prior to visit.     Per HPI unless specifically indicated in ROS section below Review of Systems Objective:  Ht 5\' 2"  (1.575 m)   Wt 236 lb (107 kg)   LMP  08/14/2020 (Exact Date) Comment: Pt has been bleeding for 2 weeks, no bleeding today started back on OC's  BMI 43.16 kg/m   Wt Readings from Last 3 Encounters:  08/30/20 236 lb (107 kg)  01/25/20 237 lb (107.5 kg)  09/23/19 242 lb (109.8 kg)       Physical exam: Gen: alert, NAD, not ill appearing Pulm: speaks in complete sentences without increased work of breathing Psych: normal mood, normal thought content      Assessment & Plan:   Problem List Items Addressed This Visit      Mild intermittent asthma, uncomplicated    Using albuterol inhaler with benefit in setting of COVID infection. Add prednisone taper.        Relevant Medications   predniSONE (DELTASONE) 20 MG tablet   COVID-19 virus infection - Primary    First day of symptoms: 08/19/2020.  Completed Paxlovid course.  Ongoing cough with coughing fits, without fever or malaise. Anticipate persistent inflammation of lungs after COVID, in h/o asthma. Will treat with prednisone taper, will Rx tussionex cough syrup for night time use.  Red flags to seek in-person care or to notify us reviewed. Low threshold to see in person and/or check CXR if persistent symptoms despite above treatment. Pt agrees with plan. She has taken and tolerated steroids well in the past.          Meds ordered this encounter  Medications   predniSONE (DELTASONE) 20 MG tablet    Sig: Take two tablets daily for 4 days followed by one tablet daily for 4 days    Dispense:  12 tablet    Refill:  0   chlorpheniramine-HYDROcodone (TUSSIONEX PENNKINETIC ER) 10-8 MG/5ML SUER    Sig: Take 5 mLs by mouth every 12 (twelve) hours as needed for cough.    Dispense:  120 mL    Refill:  0   No orders of the defined types were placed in this encounter.   I discussed the assessment and treatment plan with the patient. The patient was provided an opportunity to ask questions and all were answered. The patient agreed with the plan and demonstrated an understanding of the instructions. The patient was advised to call back or seek an in-person evaluation if the symptoms worsen or if the condition fails to improve as anticipated.  Follow up plan: No follow-ups on file.  Ria Bush, MD

## 2020-08-30 NOTE — Assessment & Plan Note (Signed)
First day of symptoms: 08/19/2020.  Completed Paxlovid course.  Ongoing cough with coughing fits, without fever or malaise. Anticipate persistent inflammation of lungs after COVID, in h/o asthma. Will treat with prednisone taper, will Rx tussionex cough syrup for night time use.  Red flags to seek in-person care or to notify us reviewed. Low threshold to see in person and/or check CXR if persistent symptoms despite above treatment. Pt agrees with plan. She has taken and tolerated steroids well in the past.

## 2020-08-30 NOTE — Telephone Encounter (Signed)
error 

## 2020-09-05 ENCOUNTER — Telehealth: Payer: Self-pay | Admitting: Obstetrics and Gynecology

## 2020-09-05 NOTE — Telephone Encounter (Signed)
LM to return call.

## 2020-09-05 NOTE — Telephone Encounter (Signed)
Pt called requesting an order for a follow up mammogram- pt states cyst found in right breast. Please Advise.

## 2020-09-13 ENCOUNTER — Other Ambulatory Visit: Payer: Self-pay

## 2020-09-13 ENCOUNTER — Ambulatory Visit (INDEPENDENT_AMBULATORY_CARE_PROVIDER_SITE_OTHER): Payer: 59 | Admitting: Obstetrics and Gynecology

## 2020-09-13 ENCOUNTER — Other Ambulatory Visit (HOSPITAL_COMMUNITY)
Admission: RE | Admit: 2020-09-13 | Discharge: 2020-09-13 | Disposition: A | Discharge status: Home / Self Care | Payer: 59 | Source: Ambulatory Visit | Attending: Obstetrics and Gynecology | Admitting: Obstetrics and Gynecology

## 2020-09-13 ENCOUNTER — Encounter: Payer: Self-pay | Admitting: Obstetrics and Gynecology

## 2020-09-13 VITALS — BP 109/74 | HR 79 | Ht 62.0 in | Wt 240.4 lb

## 2020-09-13 DIAGNOSIS — R350 Frequency of micturition: Secondary | ICD-10-CM | POA: Diagnosis not present

## 2020-09-13 DIAGNOSIS — D219 Benign neoplasm of connective and other soft tissue, unspecified: Secondary | ICD-10-CM | POA: Diagnosis not present

## 2020-09-13 DIAGNOSIS — N3001 Acute cystitis with hematuria: Secondary | ICD-10-CM

## 2020-09-13 DIAGNOSIS — Z01419 Encounter for gynecological examination (general) (routine) without abnormal findings: Secondary | ICD-10-CM

## 2020-09-13 DIAGNOSIS — Z124 Encounter for screening for malignant neoplasm of cervix: Secondary | ICD-10-CM | POA: Diagnosis present

## 2020-09-13 LAB — POCT URINALYSIS DIPSTICK
Bilirubin, UA: NEGATIVE
Glucose, UA: NEGATIVE
Ketones, UA: NEGATIVE
Leukocytes, UA: NEGATIVE
Nitrite, UA: NEGATIVE
Protein, UA: NEGATIVE
Spec Grav, UA: 1.01 (ref 1.010–1.025)
Urobilinogen, UA: 0.2 E.U./dL
pH, UA: 6 (ref 5.0–8.0)

## 2020-09-13 MED ORDER — NITROFURANTOIN MONOHYD MACRO 100 MG PO CAPS: 100.0000 mg | ORAL_CAPSULE | Freq: Two times a day (BID) | ORAL | 0 refills | Status: DC

## 2020-09-13 NOTE — Progress Notes (Signed)
HPI:      [name] is a 40 y.o. G1P1 who LMP was Patient's last menstrual period was 08/14/2020 (exact date).  Subjective:   She presents today for her annual examination.  She states that her PCP placed her on OCPs which controlled her cycles.  She then discontinued them for approximately 6 months began having night sweats and signs of menopause.  She was having menses each month during this time but she describes her periods as very heavy.  She has just been restarted on OCPs to control her cycle. (She has a known history of uterine fibroids)  Patient feels pelvic pressure and has frequent urination.  She thinks she may have a UTI.    Hx: The following portions of the patient's history were reviewed and updated as appropriate:             She  has a past medical history of Allergic rhinitis due to pollen, Hypertension, and Infertility, female (2015). She does not have any pertinent problems on file. She  has a past surgical history that includes Cesarean section (2003) and Cholecystectomy (N/A, 09/23/2019). Her family history includes Breast cancer in her maternal grandmother; Cancer in her father, maternal grandfather, and maternal grandmother; Diabetes in her father and paternal grandmother; Heart attack (age of onset: 25) in her mother; Heart disease in her mother; Hypertension in her father and mother; Hyperthyroidism (age of onset: 24) in her father. She  reports that she has been smoking cigarettes. She has been smoking an average of 0.10 packs per day. She has never used smokeless tobacco. She reports current alcohol use. She reports that she does not use drugs. She has a current medication list which includes the following prescription(s): albuterol, aspirin, cetirizine, famotidine, ketoconazole, labetalol, multivitamin, nitrofurantoin (macrocrystal-monohydrate), nortrel 1/35 (28), triamcinolone cream, chlorpheniramine-hydrocodone, and prednisone. She is allergic to amoxicillin-pot  clavulanate.       Review of Systems:  Review of Systems  Constitutional: Denied constitutional symptoms, night sweats, recent illness, fatigue, fever, insomnia and weight loss.  Eyes: Denied eye symptoms, eye pain, photophobia, vision change and visual disturbance.  Ears/Nose/Throat/Neck: Denied ear, nose, throat or neck symptoms, hearing loss, nasal discharge, sinus congestion and sore throat.  Cardiovascular: Denied cardiovascular symptoms, arrhythmia, chest pain/pressure, edema, exercise intolerance, orthopnea and palpitations.  Respiratory: Denied pulmonary symptoms, asthma, pleuritic pain, productive sputum, cough, dyspnea and wheezing.  Gastrointestinal: Denied, gastro-esophageal reflux, melena, nausea and vomiting.  Genitourinary: Denied genitourinary symptoms including symptomatic vaginal discharge, pelvic relaxation issues, and urinary complaints.  Musculoskeletal: Denied musculoskeletal symptoms, stiffness, swelling, muscle weakness and myalgia.  Dermatologic: Denied dermatology symptoms, rash and scar.  Neurologic: Denied neurology symptoms, dizziness, headache, neck pain and syncope.  Psychiatric: Denied psychiatric symptoms, anxiety and depression.  Endocrine: Denied endocrine symptoms including hot flashes and night sweats.   Meds:   Current Outpatient Medications on File Prior to Visit  Medication Sig Dispense Refill   albuterol (VENTOLIN HFA) 108 (90 Base) MCG/ACT inhaler Inhale 2 puffs into the lungs every 6 (six) hours as needed for wheezing or shortness of breath. 18 g 1   aspirin 81 MG chewable tablet Chew 81 mg by mouth daily.     cetirizine (ZYRTEC) 10 MG tablet Take 10 mg by mouth daily.  11   famotidine (PEPCID) 20 MG tablet Take 20 mg by mouth as needed for heartburn or indigestion.     ketoconazole (NIZORAL) 2 % cream Apply 1 application topically daily as needed for irritation. 60 each 1  labetalol (NORMODYNE) 200 MG tablet Take 2 tablets (400 mg total) by  mouth 2 (two) times daily. 120 tablet 11   Multiple Vitamin (MULTIVITAMIN) tablet Take 1 tablet by mouth daily.     NORTREL 1/35, 28, tablet Take 1 tablet by mouth daily.     triamcinolone cream (KENALOG) 0.1 % Apply 1 application topically 2 (two) times daily as needed. 45 g 1   chlorpheniramine-HYDROcodone (TUSSIONEX PENNKINETIC ER) 10-8 MG/5ML SUER Take 5 mLs by mouth every 12 (twelve) hours as needed for cough. (Patient not taking: Reported on 09/13/2020) 120 mL 0   predniSONE (DELTASONE) 20 MG tablet Take two tablets daily for 4 days followed by one tablet daily for 4 days (Patient not taking: Reported on 09/13/2020) 12 tablet 0   No current facility-administered medications on file prior to visit.       Upstream - 09/13/20 0815       Pregnancy Intention Screening   Does the patient want to become pregnant in the next year? No    Does the patient's partner want to become pregnant in the next year? No    Would the patient like to discuss contraceptive options today? No      Contraception Wrap Up   Current Method Oral Contraceptive    End Method Oral Contraceptive    Contraception Counseling Provided No            The pregnancy intention screening data noted above was reviewed. Potential methods of contraception were discussed. The patient elected to proceed with Oral Contraceptive.     Objective:     Vitals:   09/13/20 0812  BP: 109/74  Pulse: 79    Filed Weights   09/13/20 0812  Weight: 240 lb 6.4 oz (109 kg)              Physical examination General NAD, Conversant  HEENT Atraumatic; Op clear with mmm.  Normo-cephalic. Pupils reactive. Anicteric sclerae  Thyroid/Neck Smooth without nodularity or enlargement. Normal ROM.  Neck Supple.  Skin No rashes, lesions or ulceration. Normal palpated skin turgor. No nodularity.  Breasts: No masses or discharge.  Symmetric.  No axillary adenopathy.  Lungs: Clear to auscultation.No rales or wheezes. Normal Respiratory effort,  no retractions.  Heart: NSR.  No murmurs or rubs appreciated. No periferal edema  Abdomen: Soft.  Non-tender.  No masses.  No HSM. No hernia  Extremities: Moves all appropriately.  Normal ROM for age. No lymphadenopathy.  Neuro: Oriented to PPT.  Normal mood. Normal affect.     Pelvic:   Vulva: Normal appearance.  No lesions.  Vagina: No lesions or abnormalities noted.  Support: Normal pelvic support.  Urethra No masses tenderness or scarring.  Meatus Normal size without lesions or prolapse.  Cervix: Normal appearance.  No lesions.  Anus: Normal exam.  No lesions.  Perineum: Normal exam.  No lesions.        Bimanual   Uterus: 12 weeks size non-tender.  Mobile.  AV.  Adnexae: No masses.  Non-tender to palpation.  Cul-de-sac: Negative for abnormality.   Urine dip shows hematuria  Assessment:    G1P1 Patient Active Problem List   Diagnosis Date Noted   COVID-19 virus infection 08/30/2020   Mild intermittent asthma, uncomplicated 62/37/6283   Preventative health care 01/20/2019   Lupus anticoagulant positive 10/16/2018   Essential hypertension, benign 06/30/2018   Irritable bowel syndrome 06/30/2018   Allergic rhinitis due to pollen    Female infertility 08/27/2013  1. Well woman exam with routine gynecological exam   2. Frequent urination   3. Acute cystitis with hematuria   4. Fibroids     Fibroids and menstrual bleeding controlled with OCPs.  Now that she has restarted OCPs I expect her menopausal symptoms to abate.  Signs and symptoms including urine dip -likely UTI   Plan:            1.  Basic Screening Recommendations The basic screening recommendations for asymptomatic women were discussed with the patient during her visit.  The age-appropriate recommendations were discussed with her and the rational for the tests reviewed.  When I am informed by the patient that another primary care physician has previously obtained the age-appropriate tests and they are  up-to-date, only outstanding tests are ordered and referrals given as necessary.  Abnormal results of tests will be discussed with her when all of her results are completed.  Routine preventative health maintenance measures emphasized: Exercise/Diet/Weight control, Tobacco Warnings, Alcohol/Substance use risks and Stress Management Pap Co-test performed. -Mammogram ordered 2.  Macrobid for UTI 3.  Expect restart of OCPs to control menses (fibroids) and alleviate signs and symptoms of perimenopause.  Orders Orders Placed This Encounter  Procedures   MM 3D SCREEN BREAST BILATERAL   POCT urinalysis dipstick     Meds ordered this encounter  Medications   nitrofurantoin, macrocrystal-monohydrate, (MACROBID) 100 MG capsule    Sig: Take 1 capsule (100 mg total) by mouth 2 (two) times daily.    Dispense:  14 capsule    Refill:  0            F/U  Return in about 1 year (around 09/13/2021) for Annual Physical.  Finis Bud, M.D. 09/13/2020 8:42 AM

## 2020-09-13 NOTE — Addendum Note (Signed)
Addended by: Durwin Glaze on: 09/13/2020 08:52 AM   Modules accepted: Orders

## 2020-09-15 ENCOUNTER — Other Ambulatory Visit: Payer: Self-pay | Admitting: Internal Medicine

## 2020-09-15 LAB — CYTOLOGY - PAP
Comment: NEGATIVE
Diagnosis: NEGATIVE
High risk HPV: NEGATIVE

## 2020-09-15 NOTE — Telephone Encounter (Signed)
Spoke to pt to verify she requested this. She said she went back on the birth control several months ago because she started bleeding heavy again. Went to GYN this week and she said to keep taking it. I will send it in.

## 2020-10-10 DIAGNOSIS — R928 Other abnormal and inconclusive findings on diagnostic imaging of breast: Secondary | ICD-10-CM

## 2020-10-15 ENCOUNTER — Other Ambulatory Visit: Payer: Self-pay | Admitting: Internal Medicine

## 2020-11-15 ENCOUNTER — Telehealth: Payer: 59 | Admitting: Internal Medicine

## 2020-11-15 NOTE — Telephone Encounter (Signed)
Dr Silvio Pate has already ordered them. We do not do the precerts. The referral coordinator would do that. I will send this message that way.

## 2020-11-15 NOTE — Telephone Encounter (Signed)
Ms. Gettie called in wanted to know if Dr. Silvio Pate can write a PA for her insurance to have the follow up mammogram as medical necessary for her insurance to cover it. And her appointment is 10/4 and it has to be sent in 5 days before the appointment

## 2020-11-17 NOTE — Telephone Encounter (Signed)
What about the [place]? Would it require precert?

## 2020-11-17 NOTE — Telephone Encounter (Signed)
I do not precert Mammograms - these do not require precerting nor will the [place] site allow me to even check the code. NOt sure exactly what the patient is needing but there are no insurance requirements prior to Mammograms that I am aware of

## 2020-11-19 ENCOUNTER — Other Ambulatory Visit: Payer: Self-pay | Admitting: Internal Medicine

## 2020-11-20 NOTE — Telephone Encounter (Signed)
No Ultrasounds do not require precerts

## 2020-11-21 NOTE — Telephone Encounter (Signed)
Left detailed message on VM per DPR that orders have been put in. Per Referral Coordinator, Pt will not need precert for U/S or diagnostic mammogram.

## 2020-12-12 ENCOUNTER — Other Ambulatory Visit: Payer: 59

## 2020-12-14 ENCOUNTER — Other Ambulatory Visit: Payer: Self-pay

## 2020-12-14 ENCOUNTER — Ambulatory Visit
Admission: RE | Admit: 2020-12-14 | Discharge: 2020-12-14 | Disposition: A | Discharge status: Home / Self Care | Payer: 59 | Source: Ambulatory Visit | Attending: Internal Medicine | Admitting: Internal Medicine

## 2020-12-14 DIAGNOSIS — R928 Other abnormal and inconclusive findings on diagnostic imaging of breast: Secondary | ICD-10-CM | POA: Insufficient documentation

## 2021-01-26 ENCOUNTER — Encounter: Payer: Self-pay | Admitting: Internal Medicine

## 2021-01-26 ENCOUNTER — Other Ambulatory Visit: Payer: Self-pay

## 2021-01-26 ENCOUNTER — Ambulatory Visit (INDEPENDENT_AMBULATORY_CARE_PROVIDER_SITE_OTHER): Payer: 59 | Admitting: Internal Medicine

## 2021-01-26 VITALS — BP 130/90 | HR 75 | Temp 97.2°F | Ht 62.0 in | Wt 224.0 lb

## 2021-01-26 DIAGNOSIS — K219 Gastro-esophageal reflux disease without esophagitis: Secondary | ICD-10-CM | POA: Diagnosis not present

## 2021-01-26 DIAGNOSIS — I10 Essential (primary) hypertension: Secondary | ICD-10-CM | POA: Diagnosis not present

## 2021-01-26 DIAGNOSIS — J4520 Mild intermittent asthma, uncomplicated: Secondary | ICD-10-CM | POA: Diagnosis not present

## 2021-01-26 DIAGNOSIS — Z0000 Encounter for general adult medical examination without abnormal findings: Secondary | ICD-10-CM

## 2021-01-26 DIAGNOSIS — Z23 Encounter for immunization: Secondary | ICD-10-CM

## 2021-01-26 LAB — COMPREHENSIVE METABOLIC PANEL
ALT: 38 U/L — ABNORMAL HIGH (ref 0–35)
AST: 26 U/L (ref 0–37)
Albumin: 4.3 g/dL (ref 3.5–5.2)
Alkaline Phosphatase: 69 U/L (ref 39–117)
BUN: 12 mg/dL (ref 6–23)
CO2: 26 mEq/L (ref 19–32)
Calcium: 9.6 mg/dL (ref 8.4–10.5)
Chloride: 104 mEq/L (ref 96–112)
Creatinine, Ser: 0.85 mg/dL (ref 0.40–1.20)
GFR: 84.59 mL/min (ref >60.00)
Glucose, Bld: 84 mg/dL (ref 70–99)
Potassium: 4.1 mEq/L (ref 3.5–5.1)
Sodium: 138 mEq/L (ref 135–145)
Total Bilirubin: 0.3 mg/dL (ref 0.2–1.2)
Total Protein: 7.5 g/dL (ref 6.0–8.3)

## 2021-01-26 LAB — CBC
HCT: 41.8 % (ref 36.0–46.0)
Hemoglobin: 14 g/dL (ref 12.0–15.0)
MCHC: 33.5 g/dL (ref 30.0–36.0)
MCV: 90.7 fl (ref 78.0–100.0)
Platelets: 369 10*3/uL (ref 150.0–400.0)
RBC: 4.61 Mil/uL (ref 3.87–5.11)
RDW: 12.9 % (ref 11.5–15.5)
WBC: 9.5 10*3/uL (ref 4.0–10.5)

## 2021-01-26 NOTE — Assessment & Plan Note (Signed)
Uses the pepcid prn

## 2021-01-26 NOTE — Addendum Note (Signed)
Addended by: Pilar Grammes on: 01/26/2021 03:11 PM   Modules accepted: Orders

## 2021-01-26 NOTE — Assessment & Plan Note (Signed)
BP Readings from Last 3 Encounters:  01/26/21 130/90  09/13/20 109/74  01/25/20 124/78   Okay on the labetalol

## 2021-01-26 NOTE — Progress Notes (Signed)
Subjective:    Patient ID: Sherri Blanchard, female    DOB: [birth year], 40 y.o.   MRN: [id number]  HPI Here for physical This visit occurred during the SARS-CoV-2 public health emergency.  Safety protocols were in place, including screening questions prior to the visit, additional usage of staff PPE, and extensive cleaning of exam room while observing appropriate contact time as indicated for disinfecting solutions.   Having irregular periods Has had spotting even with OCP---like 2 days before ending active meds, then normal period No pain  Left great toe is swollen at times Will get "swollen ball" underneath it Pressure on the other toes at times also  Current Outpatient Medications on File Prior to Visit  Medication Sig Dispense Refill   albuterol (VENTOLIN HFA) 108 (90 Base) MCG/ACT inhaler Inhale 2 puffs into the lungs every 6 (six) hours as needed for wheezing or shortness of breath. 18 g 1   aspirin 81 MG chewable tablet Chew 81 mg by mouth daily.     cetirizine (ZYRTEC) 10 MG tablet Take 10 mg by mouth daily.  11   famotidine (PEPCID) 20 MG tablet Take 20 mg by mouth as needed for heartburn or indigestion.     ketoconazole (NIZORAL) 2 % cream Apply 1 application topically daily as needed for irritation. 60 each 1   labetalol (NORMODYNE) 200 MG tablet TAKE 2 TABLETS(400 MG) BY MOUTH TWICE DAILY 360 tablet 1   Multiple Vitamin (MULTIVITAMIN) tablet Take 1 tablet by mouth daily.     norethindrone-ethinyl estradiol 1/35 (NORTREL 1/35, 28,) tablet TAKE 1 TABLET BY MOUTH DAILY 28 tablet 12   triamcinolone cream (KENALOG) 0.1 % Apply 1 application topically 2 (two) times daily as needed. 45 g 1   No current facility-administered medications on file prior to visit.    Allergies  Allergen Reactions   Amoxicillin-Pot Clavulanate Other (See Comments)    Yeast infection  Yeast infection per pt    Past Medical History:  Diagnosis Date   Allergic rhinitis due to pollen    Hypertension     Infertility, female 77    Past Surgical History:  Procedure Laterality Date   CESAREAN SECTION  2003   CHOLECYSTECTOMY N/A 09/23/2019   Procedure: LAPAROSCOPIC CHOLECYSTECTOMY;  Surgeon: Jesusita Oka, MD;  Location: [facility] OR;  Service: General;  Laterality: N/A;    Family History  Problem Relation Age of Onset   Hypertension Mother    Heart disease Mother        had MI   Heart attack Mother 72       April 2019   Diabetes Father    Hypertension Father    Cancer Father        PROSTATE   Hyperthyroidism Father 60       just DX April 2020   Breast cancer Maternal Grandmother    Cancer Maternal Grandmother        BONE   Cancer Maternal Grandfather        LUNG   Diabetes Paternal Grandmother     Social History   Socioeconomic History   Marital status: Divorced    Spouse name: Not on file   Number of children: 1   Years of education: Not on file   Highest education level: Not on file  Occupational History   Occupation: Ship broker: [employer]  Tobacco Use   Smoking status: Some Days    Packs/day: 0.10    Types: Cigarettes  Smokeless tobacco: Never  Vaping Use   Vaping Use: Never used  Substance and Sexual Activity   Alcohol use: Yes    Comment: OCC   Drug use: No   Sexual activity: Yes    Partners: Male    Birth control/protection: OCP  Other Topics Concern   Not on file  Social History Narrative   With stable partner since 2011   Social Determinants of Health   Financial Resource Strain: Not on file  Food Insecurity: Not on file  Transportation Needs: Not on file  Physical Activity: Not on file  Stress: Not on file  Social Connections: Not on file  Intimate Partner Violence: Not on file   Review of Systems  Constitutional:  Negative for fatigue.       Is on diet---slim fast--stopped sodas and sweets Lost 20# Some exercise Wears seat belt  HENT:  Negative for dental problem, hearing loss, tinnitus and trouble  swallowing.        Keeps up with dentist  Eyes:  Negative for visual disturbance.       Needs eye appt---blurry at times (sounds like normal for age)  Respiratory:  Negative for cough, chest tightness, shortness of breath and wheezing.        Hasn't needed albuterol lately  Cardiovascular:  Negative for chest pain, palpitations and leg swelling.  Gastrointestinal:  Negative for blood in stool and constipation.       Some heartburn--- uses the pepcid prn (twice a week or so)  Endocrine: Negative for polydipsia and polyuria.  Genitourinary:  Negative for dyspareunia, dysuria and hematuria.  Musculoskeletal:  Positive for back pain. Negative for joint swelling.       Some left knee pain--tylenol helps  Skin:  Negative for rash.  Allergic/Immunologic: Positive for environmental allergies. Negative for immunocompromised state.       Controlled with zyrtec  Neurological:        Occ dizziness--relates to BP meds Headaches sometimes--like if doesn't sleep right  Hematological:  Negative for adenopathy. Does not bruise/bleed easily.  Psychiatric/Behavioral:  Negative for dysphoric mood. The patient is not nervous/anxious.        Some sweats at night--awakening (early menopause)      Objective:   Physical Exam Constitutional:      Appearance: Normal appearance.  HENT:     Right Ear: Tympanic membrane and ear canal normal.     Left Ear: Tympanic membrane and ear canal normal.     Mouth/Throat:     Pharynx: No oropharyngeal exudate or posterior oropharyngeal erythema.  Eyes:     Conjunctiva/sclera: Conjunctivae normal.     Pupils: Pupils are equal, round, and reactive to light.  Cardiovascular:     Rate and Rhythm: Normal rate and regular rhythm.     Pulses: Normal pulses.     Heart sounds: No murmur heard.   No gallop.  Pulmonary:     Effort: Pulmonary effort is normal.     Breath sounds: Normal breath sounds. No wheezing or rales.  Abdominal:     Palpations: Abdomen is soft.      Tenderness: There is no abdominal tenderness.  Musculoskeletal:     Cervical back: Neck supple.     Right lower leg: No edema.     Left lower leg: No edema.     Comments: No abnormality in left great toe now  Lymphadenopathy:     Cervical: No cervical adenopathy.  Skin:    Findings: No lesion or rash.  Neurological:     General: No focal deficit present.     Mental Status: She is alert and oriented to person, place, and time.  Psychiatric:        Mood and Affect: Mood normal.        Behavior: Behavior normal.           Assessment & Plan:

## 2021-01-26 NOTE — Assessment & Plan Note (Signed)
Quiet lately

## 2021-01-26 NOTE — Assessment & Plan Note (Signed)
Healthy Lost weight with dietary changes---needs to exercise also Flu vaccine today Needs bivalent COVID vaccine Mammogram again in about a year

## 2021-04-21 IMAGING — MG DIGITAL SCREENING BILAT W/ TOMO W/ CAD
8 series · 8 of 24 positions shown · non-contrast
Comparison: Previous exam(s).

ACR Breast Density Category a: The breast tissue is almost entirely
fatty.

CLINICAL DATA: Screening.

EXAM:
DIGITAL SCREENING BILATERAL MAMMOGRAM WITH TOMO AND CAD

[L MLO synth-2D]
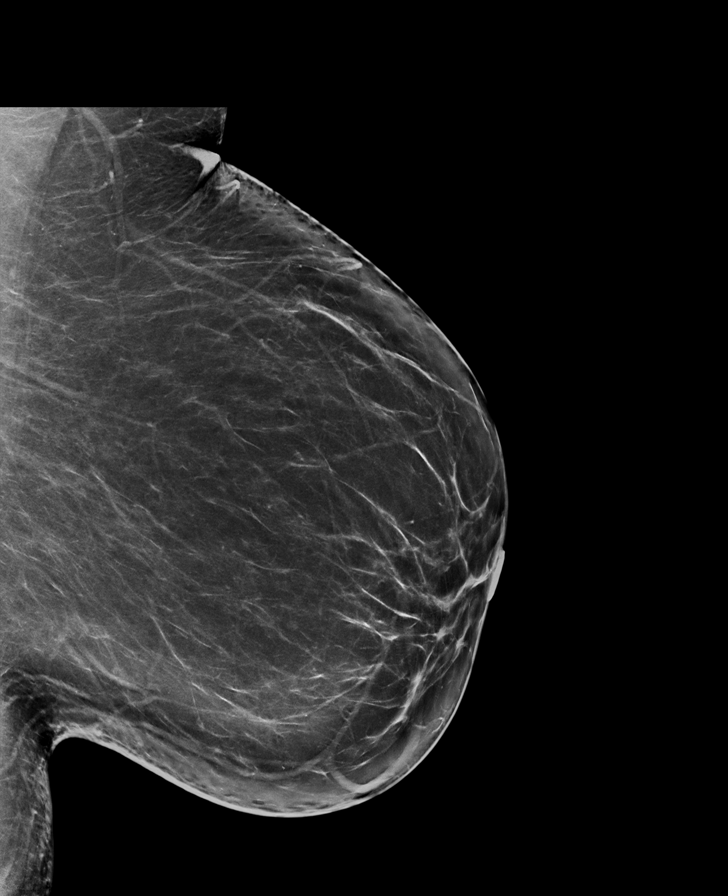

[L CC synth-2D]
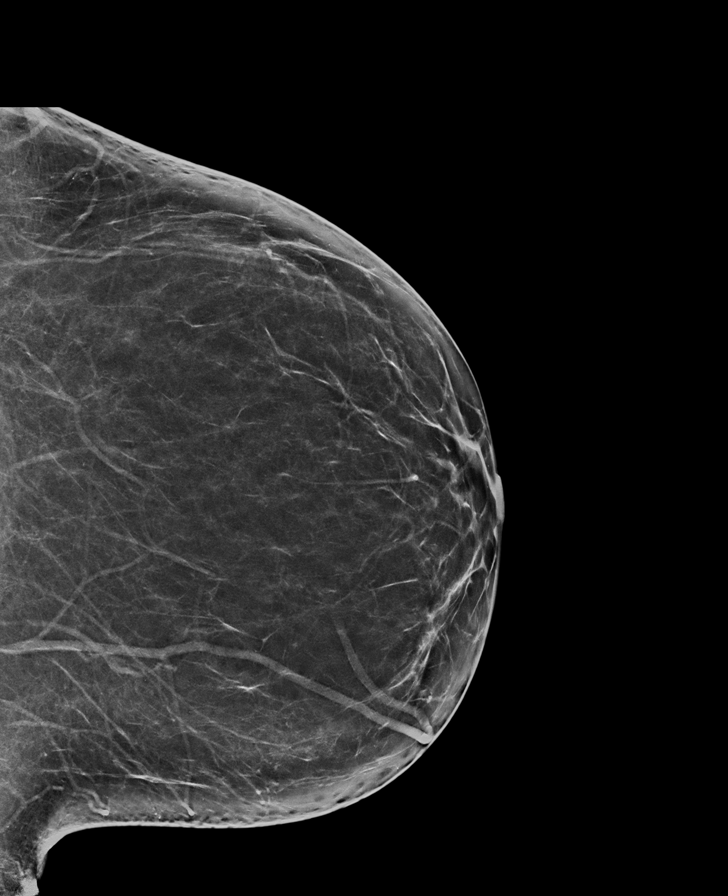

[R CC synth-2D]
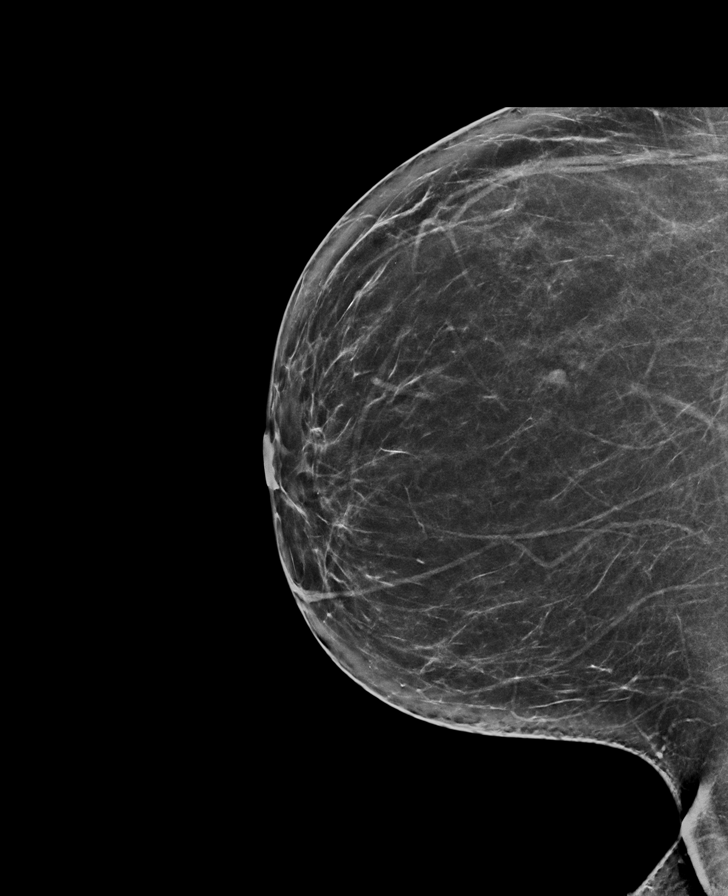

[R MLO synth-2D]
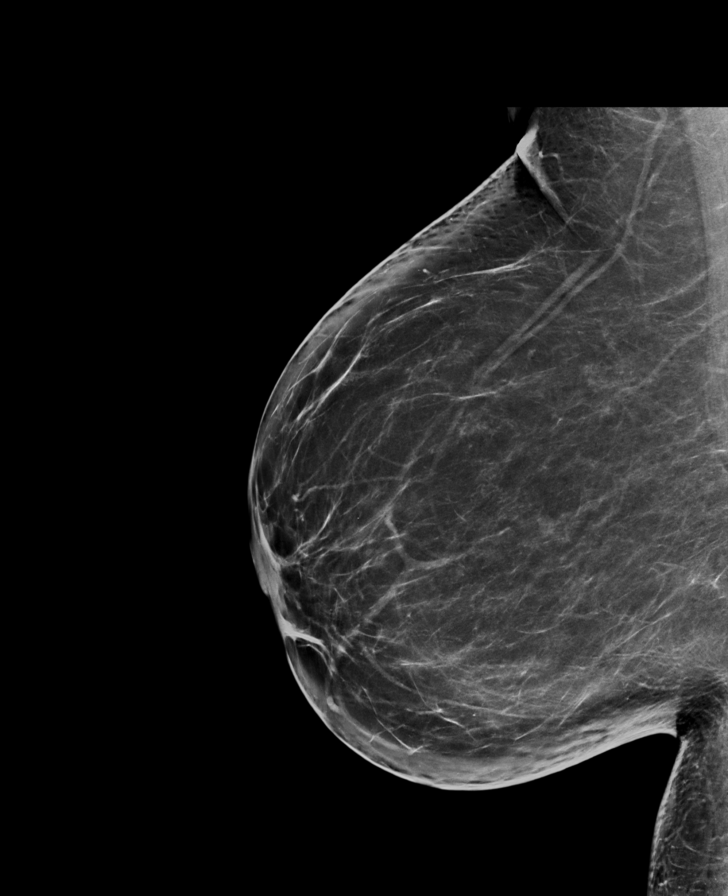

[L MLO tomo · tomo slice 47/93.0]
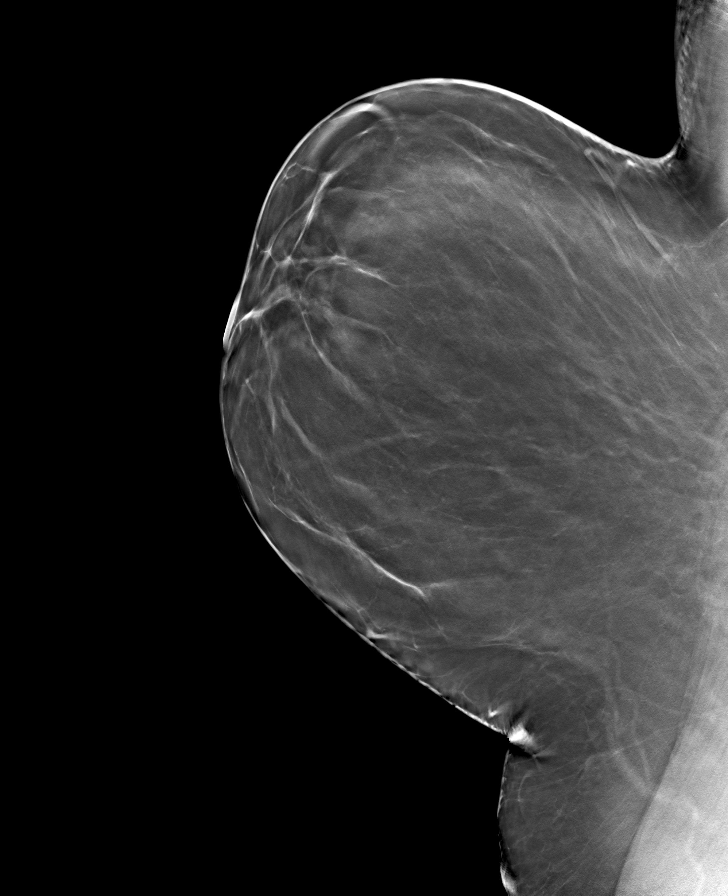

[R MLO tomo · tomo slice 45/90.0]
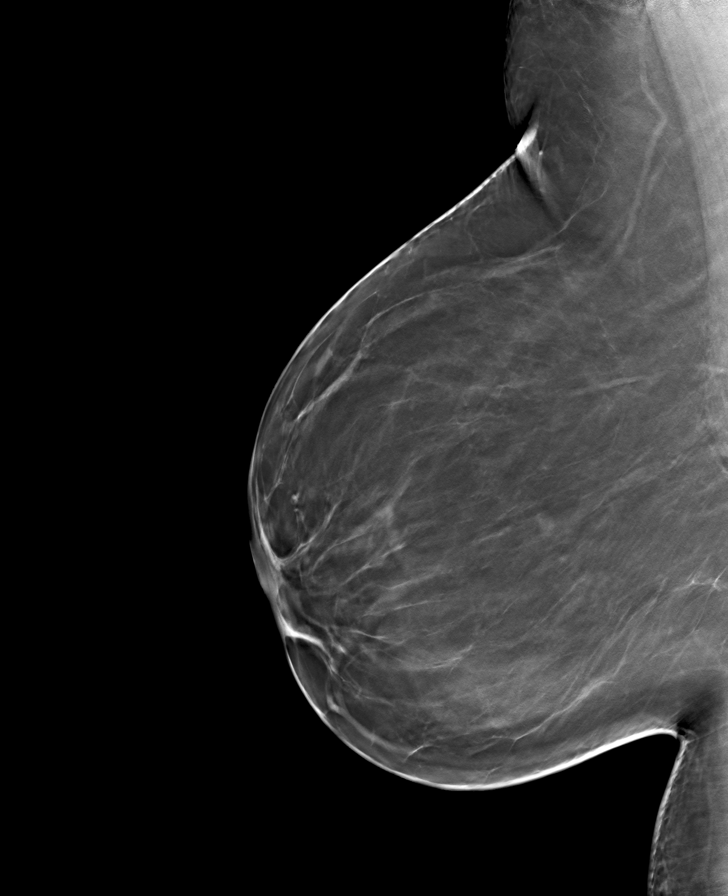

[R CC tomo · tomo slice 41/81.0]
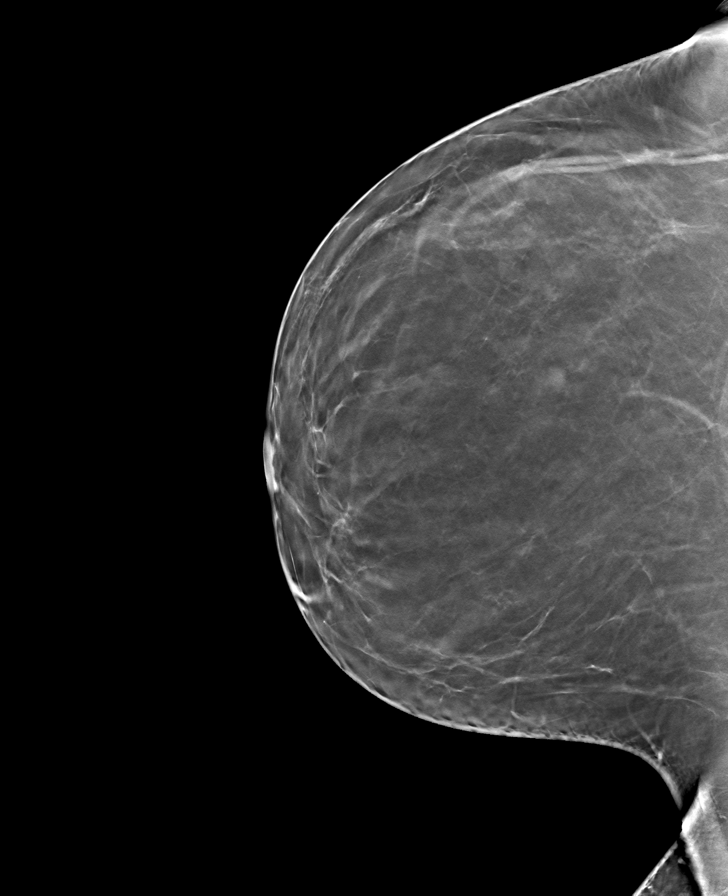

[L CC tomo · tomo slice 41/82.0]
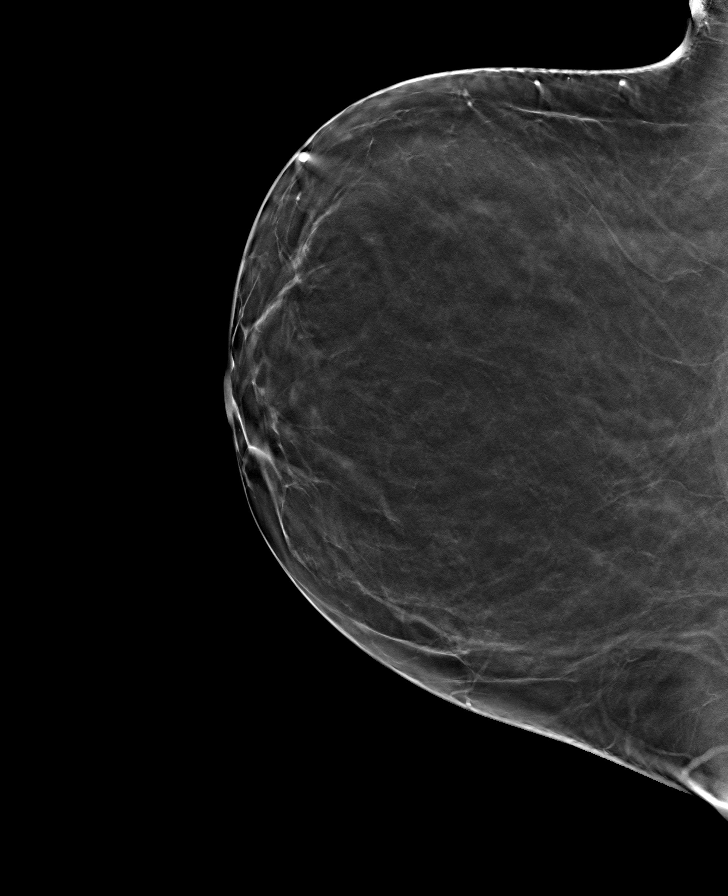

[8 of 24 positions shown; findings below may reference images not displayed]

FINDINGS: In the right breast, a possible mass warrants further evaluation. In
the left breast, no findings suspicious for malignancy. Images were
processed with CAD.
IMPRESSION: Further evaluation is suggested for a possible mass in the right
breast.

RECOMMENDATION:
Diagnostic mammogram and possibly ultrasound of the right breast.
(Code:[id number]-996)

The patient will be contacted regarding the findings, and additional
imaging will be scheduled.

BI-RADS CATEGORY  0: Incomplete. Need additional imaging evaluation
and/or prior mammograms for comparison.

## 2021-05-04 ENCOUNTER — Telehealth (INDEPENDENT_AMBULATORY_CARE_PROVIDER_SITE_OTHER): Payer: 59 | Admitting: Internal Medicine

## 2021-05-04 ENCOUNTER — Other Ambulatory Visit: Payer: Self-pay

## 2021-05-04 ENCOUNTER — Encounter: Payer: Self-pay | Admitting: Internal Medicine

## 2021-05-04 DIAGNOSIS — J4521 Mild intermittent asthma with (acute) exacerbation: Secondary | ICD-10-CM | POA: Insufficient documentation

## 2021-05-04 MED ORDER — ALBUTEROL SULFATE (2.5 MG/3ML) 0.083% IN NEBU: 2.5000 mg | INHALATION_SOLUTION | Freq: Four times a day (QID) | RESPIRATORY_TRACT | 1 refills | Status: AC | PRN

## 2021-05-04 MED ORDER — PREDNISONE 20 MG PO TABS: ORAL_TABLET | ORAL | 0 refills | Status: DC

## 2021-05-04 NOTE — Assessment & Plan Note (Signed)
Looks okay but having mild ongoing wheezing and SOB Will give prednisone burst again-- 40 daily x 4, then 20 daily x 4 Discussed using a spacer with the inhaler--but refilled the neb solution If flares occurring with each cold, can start inhaler Rx (steroid/LAMA) at start of respiratory infections

## 2021-05-04 NOTE — Progress Notes (Signed)
Subjective:    Patient ID: Sherri Blanchard, female    DOB: 1980/10/18, 41 y.o.   MRN: [id number]  HPI Video virtual visit for worsening of her asthma Identification done Reviewed limitations and billing and she gave consent Participants---patient at workand I am in my office  Asthma has been active Started with congestion and URI 2 weeks ago Cough is better now but still congested COVID negative--but feels like when she had COVID last year Mucinex not helping  Had to use nebulizer---inhaler not working Had wheezing, SOB Nebulizer did help---after 2 treatments Prednisone did help last year  Current Outpatient Medications on File Prior to Visit  Medication Sig Dispense Refill   albuterol (VENTOLIN HFA) 108 (90 Base) MCG/ACT inhaler Inhale 2 puffs into the lungs every 6 (six) hours as needed for wheezing or shortness of breath. 18 g 1   aspirin 81 MG chewable tablet Chew 81 mg by mouth daily.     cetirizine (ZYRTEC) 10 MG tablet Take 10 mg by mouth daily.  11   famotidine (PEPCID) 20 MG tablet Take 20 mg by mouth as needed for heartburn or indigestion.     ketoconazole (NIZORAL) 2 % cream Apply 1 application topically daily as needed for irritation. 60 each 1   labetalol (NORMODYNE) 200 MG tablet TAKE 2 TABLETS(400 MG) BY MOUTH TWICE DAILY 360 tablet 1   Multiple Vitamin (MULTIVITAMIN) tablet Take 1 tablet by mouth daily.     norethindrone-ethinyl estradiol 1/35 (NORTREL 1/35, 28,) tablet TAKE 1 TABLET BY MOUTH DAILY 28 tablet 12   triamcinolone cream (KENALOG) 0.1 % Apply 1 application topically 2 (two) times daily as needed. 45 g 1   No current facility-administered medications on file prior to visit.    Allergies  Allergen Reactions   Amoxicillin-Pot Clavulanate Other (See Comments)    Yeast infection  Yeast infection per pt    Past Medical History:  Diagnosis Date   Allergic rhinitis due to pollen    Hypertension    Infertility, female 87    Past Surgical History:   Procedure Laterality Date   CESAREAN SECTION  2003   CHOLECYSTECTOMY N/A 09/23/2019   Procedure: LAPAROSCOPIC CHOLECYSTECTOMY;  Surgeon: Jesusita Oka, MD;  Location: [facility] OR;  Service: General;  Laterality: N/A;    Family History  Problem Relation Age of Onset   Hypertension Mother    Heart disease Mother        had MI   Heart attack Mother 72       April 2019   Diabetes Father    Hypertension Father    Cancer Father        PROSTATE   Hyperthyroidism Father 46       just DX April 2020   Breast cancer Maternal Grandmother    Cancer Maternal Grandmother        BONE   Cancer Maternal Grandfather        LUNG   Diabetes Paternal Grandmother     Social History   Socioeconomic History   Marital status: Single    Spouse name: Not on file   Number of children: 1   Years of education: Not on file   Highest education level: Not on file  Occupational History   Occupation: Ship broker: [employer]  Tobacco Use   Smoking status: Some Days    Packs/day: 0.10    Types: Cigarettes    Passive exposure: Never   Smokeless tobacco: Never  Vaping Use   Vaping Use: Never used  Substance and Sexual Activity   Alcohol use: Yes    Comment: OCC   Drug use: No   Sexual activity: Yes    Partners: Male    Birth control/protection: OCP  Other Topics Concern   Not on file  Social History Narrative   With stable partner since 2011   Social Determinants of Health   Financial Resource Strain: Not on file  Food Insecurity: Not on file  Transportation Needs: Not on file  Physical Activity: Not on file  Stress: Not on file  Social Connections: Not on file  Intimate Partner Violence: Not on file   Review of Systems Did have chills and body aches at the beginning  Some nausea for 1 day Eating okay     Objective:   Physical Exam Pulmonary:     Effort: Pulmonary effort is normal. No respiratory distress.  Neurological:     Mental Status: She is alert.            Assessment & Plan:

## 2021-05-06 IMAGING — US US BREAST*R* LIMITED INC AXILLA
1 series · 1 of 1 positions shown · non-contrast
Comparison: Prior films

CLINICAL DATA: Callback from screening mammogram for possible right
breast

EXAM:
DIGITAL DIAGNOSTIC right MAMMOGRAM WITH CAD AND TOMO
ULTRASOUND right BREAST

[Series 1: us breast*right* limited inc axilla · 1 of 392 frames shown]
[frame 197/392]
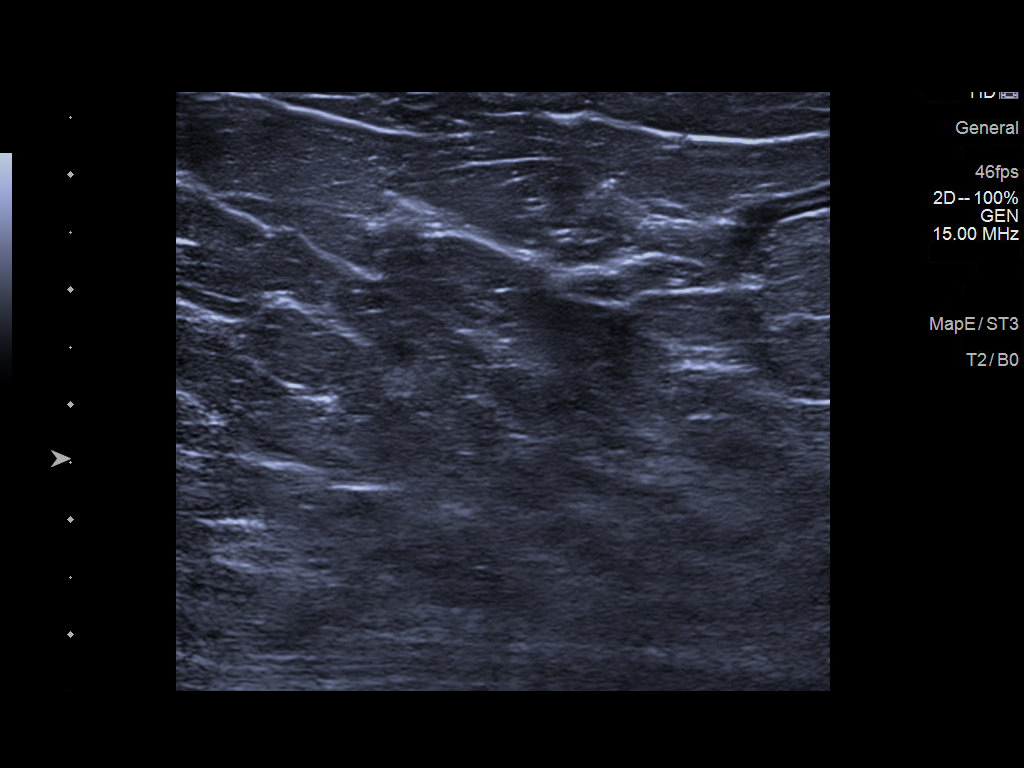

[1 of 1 positions shown; findings below may reference images not displayed]

ACR Breast Density Category b: There are scattered areas of
fibroglandular density.
FINDINGS: Spot compression cc and MLO views of the right breast are submitted.
Previously questioned mass in the slight lateral slight lower right
breast persists on additional views.

Mammographic images were processed with CAD.

Targeted ultrasound is performed, showing focally dilated duct at
the right breast 9 o'clock 2 cm from nipple likely correlating to
the mammographic finding.
IMPRESSION: Probable benign findings.

RECOMMENDATION:
Six-month follow-up mammogram and ultrasound of right breast.

I have discussed the findings and recommendations with the patient.
If applicable, a reminder letter will be sent to the patient
regarding the next appointment.

BI-RADS CATEGORY  3: Probably benign.

## 2021-05-23 ENCOUNTER — Other Ambulatory Visit: Payer: Self-pay | Admitting: Internal Medicine

## 2021-08-27 ENCOUNTER — Other Ambulatory Visit: Payer: Self-pay | Admitting: Internal Medicine

## 2021-09-18 ENCOUNTER — Encounter: Payer: Self-pay | Admitting: Obstetrics and Gynecology

## 2021-09-18 ENCOUNTER — Ambulatory Visit (INDEPENDENT_AMBULATORY_CARE_PROVIDER_SITE_OTHER): Payer: 59 | Admitting: Obstetrics and Gynecology

## 2021-09-18 VITALS — BP 138/87 | HR 75 | Ht 62.0 in | Wt 228.4 lb

## 2021-09-18 DIAGNOSIS — R102 Pelvic and perineal pain: Secondary | ICD-10-CM

## 2021-09-18 DIAGNOSIS — D219 Benign neoplasm of connective and other soft tissue, unspecified: Secondary | ICD-10-CM | POA: Diagnosis not present

## 2021-09-18 DIAGNOSIS — Z87440 Personal history of urinary (tract) infections: Secondary | ICD-10-CM | POA: Diagnosis not present

## 2021-09-18 DIAGNOSIS — Z01411 Encounter for gynecological examination (general) (routine) with abnormal findings: Secondary | ICD-10-CM

## 2021-09-18 DIAGNOSIS — N3001 Acute cystitis with hematuria: Secondary | ICD-10-CM

## 2021-09-18 DIAGNOSIS — N951 Menopausal and female climacteric states: Secondary | ICD-10-CM

## 2021-09-18 DIAGNOSIS — Z1231 Encounter for screening mammogram for malignant neoplasm of breast: Secondary | ICD-10-CM

## 2021-09-18 DIAGNOSIS — Z01419 Encounter for gynecological examination (general) (routine) without abnormal findings: Secondary | ICD-10-CM

## 2021-09-18 LAB — POCT URINALYSIS DIPSTICK
Bilirubin, UA: NEGATIVE
Glucose, UA: NEGATIVE
Ketones, UA: NEGATIVE
Leukocytes, UA: NEGATIVE
Nitrite, UA: NEGATIVE
Protein, UA: POSITIVE — AB
Spec Grav, UA: >=1.03 — AB (ref 1.010–1.025)
Urobilinogen, UA: 0.2 E.U./dL
pH, UA: 6 (ref 5.0–8.0)

## 2021-09-18 MED ORDER — NITROFURANTOIN MONOHYD MACRO 100 MG PO CAPS: 100.0000 mg | ORAL_CAPSULE | Freq: Two times a day (BID) | ORAL | 1 refills | Status: DC

## 2021-09-18 NOTE — Progress Notes (Signed)
HPI:      [name] is a 41 y.o. G1P1 who LMP was Patient's last menstrual period was 09/04/2021 (exact date).  Subjective:   She presents today for her annual examination.  She is taking OCPs to control her menses.  They are doing well controlling her menses despite her uterine fibroids. Patient has again begun to smoke. She does complain of occasionally feeling hot and says these might be hot flashes.  She had the same complaint last year. She discusses urinary symptoms.    Hx: The following portions of the patient's history were reviewed and updated as appropriate:             She  has a past medical history of Allergic rhinitis due to pollen, Hypertension, and Infertility, female (2015). She does not have any pertinent problems on file. She  has a past surgical history that includes Cesarean section (2003) and Cholecystectomy (N/A, 09/23/2019). Her family history includes Breast cancer in her maternal grandmother; Cancer in her father, maternal grandfather, and maternal grandmother; Diabetes in her father and paternal grandmother; Heart attack (age of onset: 23) in her mother; Heart disease in her mother; Hypertension in her father and mother; Hyperthyroidism (age of onset: 31) in her father. She  reports that she has been smoking cigarettes. She has been smoking an average of .1 packs per day. She has never been exposed to tobacco smoke. She has never used smokeless tobacco. She reports current alcohol use. She reports that she does not use drugs. She has a current medication list which includes the following prescription(s): albuterol, albuterol, aspirin, cetirizine, famotidine, labetalol, multivitamin, nitrofurantoin (macrocrystal-monohydrate), and nortrel 1/35 (28). She is allergic to amoxicillin-pot clavulanate.       Review of Systems:  Review of Systems  Constitutional: Denied constitutional symptoms, night sweats, recent illness, fatigue, fever, insomnia and weight loss.   Eyes: Denied eye symptoms, eye pain, photophobia, vision change and visual disturbance.  Ears/Nose/Throat/Neck: Denied ear, nose, throat or neck symptoms, hearing loss, nasal discharge, sinus congestion and sore throat.  Cardiovascular: Denied cardiovascular symptoms, arrhythmia, chest pain/pressure, edema, exercise intolerance, orthopnea and palpitations.  Respiratory: Denied pulmonary symptoms, asthma, pleuritic pain, productive sputum, cough, dyspnea and wheezing.  Gastrointestinal: Denied, gastro-esophageal reflux, melena, nausea and vomiting.  Genitourinary:   See HPI for additional information.  Musculoskeletal: Denied musculoskeletal symptoms, stiffness, swelling, muscle weakness and myalgia.  Dermatologic: Denied dermatology symptoms, rash and scar.  Neurologic: Denied neurology symptoms, dizziness, headache, neck pain and syncope.  Psychiatric: Denied psychiatric symptoms, anxiety and depression.  Endocrine: Denied endocrine symptoms including hot flashes and night sweats.   Meds:   Current Outpatient Medications on File Prior to Visit  Medication Sig Dispense Refill   albuterol (PROVENTIL) (2.5 MG/3ML) 0.083% nebulizer solution Take 3 mLs (2.5 mg total) by nebulization every 6 (six) hours as needed for wheezing or shortness of breath. 60 mL 1   albuterol (VENTOLIN HFA) 108 (90 Base) MCG/ACT inhaler Inhale 2 puffs into the lungs every 6 (six) hours as needed for wheezing or shortness of breath. 18 g 1   aspirin 81 MG chewable tablet Chew 81 mg by mouth daily.     cetirizine (ZYRTEC) 10 MG tablet Take 10 mg by mouth daily.  11   famotidine (PEPCID) 20 MG tablet Take 20 mg by mouth as needed for heartburn or indigestion.     labetalol (NORMODYNE) 200 MG tablet TAKE 2 TABLETS(400 MG) BY MOUTH TWICE DAILY 360 tablet 2   Multiple Vitamin (  MULTIVITAMIN) tablet Take 1 tablet by mouth daily.     NORTREL 1/35, 28, tablet TAKE 1 TABLET BY MOUTH DAILY 84 tablet 1   No current  facility-administered medications on file prior to visit.     Objective:     Vitals:   09/18/21 0809  BP: 138/87  Pulse: 75    Filed Weights   09/18/21 0809  Weight: 228 lb 6.4 oz (103.6 kg)  Physical examination General NAD, Conversant  HEENT Atraumatic; Op clear with mmm.  Normo-cephalic. Pupils reactive. Anicteric sclerae  Thyroid/Neck Smooth without nodularity or enlargement. Normal ROM.  Neck Supple.  Skin No rashes, lesions or ulceration. Normal palpated skin turgor. No nodularity.  Breasts: No masses or discharge.  Symmetric.  No axillary adenopathy.  Lungs: Clear to auscultation.No rales or wheezes. Normal Respiratory effort, no retractions.  Heart: NSR.  No murmurs or rubs appreciated. No periferal edema  Abdomen: Soft.  Non-tender.  No masses.  No HSM. No hernia  Extremities: Moves all appropriately.  Normal ROM for age. No lymphadenopathy.  Neuro: Oriented to PPT.  Normal mood. Normal affect.     Pelvic:   Vulva: Normal appearance.  No lesions.  Vagina: No lesions or abnormalities noted.  Support: Normal pelvic support.  Urethra No masses tenderness or scarring.  Meatus Normal size without lesions or prolapse.  Cervix: Normal appearance.  No lesions.  Anus: Normal exam.  No lesions.  Perineum: Normal exam.  No lesions.        Bimanual   Uterus: 12 to 14 weeks size non-tender.  Mobile.  AV.  Adnexae: No masses.  Non-tender to palpation.  Cul-de-sac: Negative for abnormality.     UA consistent with UTI showing blood.          Assessment:    G1P1 Patient Active Problem List   Diagnosis Date Noted   Mild intermittent asthma with (acute) exacerbation 05/04/2021   GERD (gastroesophageal reflux disease) 01/26/2021   COVID-19 virus infection 08/30/2020   Mild intermittent asthma, uncomplicated 54/27/0623   Preventative health care 01/20/2019   Lupus anticoagulant positive 10/16/2018   Essential hypertension, benign 06/30/2018   Irritable bowel syndrome  06/30/2018   Allergic rhinitis due to pollen    Female infertility 08/27/2013     1. Well woman exam with routine gynecological exam   2. Screening mammogram for breast cancer   3. Fibroids   4. Menopausal symptom   5. Pelvic pressure in female   6. History of UTI   7. Acute cystitis with hematuria     Patient likely not having menopausal symptoms attributable to hypoestrogenism.  While taking OCPs.  We will check TSH with annual labs.  Strongly advised to discontinue tobacco use or discontinue OCPs.  Patient says she will quit smoking.  Blood clot risk discussed in detail.  Patient has urinary blood and symptoms-UTI   Plan:            1.  Basic Screening Recommendations The basic screening recommendations for asymptomatic women were discussed with the patient during her visit.  The age-appropriate recommendations were discussed with her and the rational for the tests reviewed.  When I am informed by the patient that another primary care physician has previously obtained the age-appropriate tests and they are up-to-date, only outstanding tests are ordered and referrals given as necessary.  Abnormal results of tests will be discussed with her when all of her results are completed.  Routine preventative health maintenance measures emphasized: Exercise/Diet/Weight control, Tobacco Warnings,  Alcohol/Substance use risks and Stress Management Mammogram ordered yearly labs 2.  Include TSH and lab work 3.  Strongly advised to discontinue tobacco use or discontinue OCPs.  Patient states she will stop smoking.  She is "done it before". 4.  Macrobid for UTI Orders Orders Placed This Encounter  Procedures   MM DIGITAL SCREENING BILATERAL   Basic metabolic panel   CBC   TSH   Lipid panel   Hemoglobin A1c   POCT urinalysis dipstick     Meds ordered this encounter  Medications   nitrofurantoin, macrocrystal-monohydrate, (MACROBID) 100 MG capsule    Sig: Take 1 capsule (100 mg total) by  mouth 2 (two) times daily.    Dispense:  14 capsule    Refill:  1            F/U  Return in about 1 year (around 09/19/2022) for Annual Physical.  Finis Bud, M.D. 09/18/2021 8:47 AM

## 2021-09-18 NOTE — Progress Notes (Signed)
Patients presents for annual exam today. She states experiencing pelvic pressure over the weekend, states history of UTI's. She states regular menstrual cycles with OCP. Patient is experiencing hot flashes, night sweats, trouble sleeping and has trouble concentrating on occasion. Patient is up to date on pap smear. Patient is due for mammogram, ordered. Patients annual labs are ordered. Patient states no other questions or concerns at this time.

## 2021-09-19 LAB — CBC
Hematocrit: 39.9 % (ref 34.0–46.6)
Hemoglobin: 13.8 g/dL (ref 11.1–15.9)
MCH: 30.7 pg (ref 26.6–33.0)
MCHC: 34.6 g/dL (ref 31.5–35.7)
MCV: 89 fL (ref 79–97)
Platelets: 328 10*3/uL (ref 150–450)
RBC: 4.49 x10E6/uL (ref 3.77–5.28)
RDW: 12.2 % (ref 11.7–15.4)
WBC: 9.5 10*3/uL (ref 3.4–10.8)

## 2021-09-19 LAB — BASIC METABOLIC PANEL
BUN/Creatinine Ratio: 10 (ref 9–23)
BUN: 9 mg/dL (ref 6–24)
CO2: 21 mmol/L (ref 20–29)
Calcium: 9 mg/dL (ref 8.7–10.2)
Chloride: 101 mmol/L (ref 96–106)
Creatinine, Ser: 0.86 mg/dL (ref 0.57–1.00)
Glucose: 87 mg/dL (ref 70–99)
Potassium: 4.6 mmol/L (ref 3.5–5.2)
Sodium: 135 mmol/L (ref 134–144)
eGFR: 86 mL/min/{1.73_m2} (ref >59)

## 2021-09-19 LAB — TSH: TSH: 1.91 u[IU]/mL (ref 0.450–4.500)

## 2021-09-19 LAB — LIPID PANEL
Chol/HDL Ratio: 3.6 ratio (ref 0.0–4.4)
Cholesterol, Total: 185 mg/dL (ref 100–199)
HDL: 52 mg/dL (ref >39)
LDL Chol Calc (NIH): 102 mg/dL — ABNORMAL HIGH (ref 0–99)
Triglycerides: 177 mg/dL — ABNORMAL HIGH (ref 0–149)
VLDL Cholesterol Cal: 31 mg/dL (ref 5–40)

## 2021-09-19 LAB — HEMOGLOBIN A1C
Est. average glucose Bld gHb Est-mCnc: 117 mg/dL
Hgb A1c MFr Bld: 5.7 % — ABNORMAL HIGH (ref 4.8–5.6)

## 2021-09-19 NOTE — Progress Notes (Signed)
Aricela: Your results show a small increase in your A1C.  This often indicates that you have pre-diabetes or insulin resistance.  I recommend you contact your family doctor to discuss this finding. Dr. Amalia Hailey

## 2021-09-20 NOTE — Progress Notes (Signed)
Recommendation forwarded via MyChart

## 2021-10-19 ENCOUNTER — Telehealth: Payer: Self-pay | Admitting: Internal Medicine

## 2021-10-19 DIAGNOSIS — Z119 Encounter for screening for infectious and parasitic diseases, unspecified: Secondary | ICD-10-CM

## 2021-10-19 NOTE — Telephone Encounter (Signed)
Patient coming in on Wednesday for a TB test and also needs to find out if she needs Hep B.  She needs for a phlebotomy course and online it is showing invalid  Please advise if test if needed an MD order, patient would like to have on same date as TB test

## 2021-10-19 NOTE — Telephone Encounter (Signed)
Please have the lab folks check to make sure I ordered the correct labs

## 2021-10-19 NOTE — Telephone Encounter (Signed)
Pt is scheduled for labs 10-23-21. Karna Christmas, can you make sure the right things have been ordered? Thanks

## 2021-10-19 NOTE — Telephone Encounter (Signed)
Spoke to pt. Advised she has 3 Hep B vaccines on record from 1997 and probably needs a titer done. Pt would also like to do Quantiferon instead of the PPD for b testing. Pt is scheduled for labs on 10-23-21. Please place orders for Hep B Titer and Quantiferon. Thanks.

## 2021-10-22 ENCOUNTER — Telehealth: Payer: Self-pay | Admitting: Internal Medicine

## 2021-10-22 DIAGNOSIS — Z03818 Encounter for observation for suspected exposure to other biological agents ruled out: Secondary | ICD-10-CM

## 2021-10-22 NOTE — Addendum Note (Signed)
Addended by: Viviana Simpler I on: 10/22/2021 04:09 PM   Modules accepted: Orders

## 2021-10-22 NOTE — Telephone Encounter (Signed)
Patient called in and was wanting to know if Varicella titer can be added to her labs for tomorrow. She stated that she needs it for school. Thank you!

## 2021-10-22 NOTE — Telephone Encounter (Signed)
Let her know that I added it

## 2021-10-23 ENCOUNTER — Other Ambulatory Visit (INDEPENDENT_AMBULATORY_CARE_PROVIDER_SITE_OTHER): Payer: 59

## 2021-10-23 ENCOUNTER — Ambulatory Visit: Payer: 59

## 2021-10-23 DIAGNOSIS — Z119 Encounter for screening for infectious and parasitic diseases, unspecified: Secondary | ICD-10-CM

## 2021-10-23 DIAGNOSIS — Z03818 Encounter for observation for suspected exposure to other biological agents ruled out: Secondary | ICD-10-CM

## 2021-10-23 NOTE — Telephone Encounter (Signed)
Called patient let know test have been added.

## 2021-10-25 NOTE — Telephone Encounter (Signed)
Patient called in and was wanting to know if she needed all three Hep B vaccines or just need one Hep B vaccine. Please advise. Thank you!

## 2021-10-25 NOTE — Telephone Encounter (Signed)
This was the correct test. She will need to get the 2 part Heplisav Hep B series. She is going to check with a local pharmacy and if she cannot get it there, she will call to schedule a NV here.

## 2021-10-26 LAB — QUANTIFERON-TB GOLD PLUS
Mitogen-NIL: >10 IU/mL
NIL: 0.05 IU/mL
QuantiFERON-TB Gold Plus: NEGATIVE
TB1-NIL: 0 IU/mL
TB2-NIL: 0 IU/mL

## 2021-10-26 LAB — VARICELLA ZOSTER ANTIBODY, IGG: Varicella IgG: 2878 index

## 2021-10-26 LAB — HEPATITIS B SURFACE ANTIBODY,QUALITATIVE: Hep B S Ab: NONREACTIVE

## 2021-10-31 ENCOUNTER — Telehealth: Payer: Self-pay

## 2021-10-31 NOTE — Telephone Encounter (Signed)
Faxed record to number provided in the note.

## 2021-10-31 NOTE — Telephone Encounter (Signed)
Patient recently had her HEP B vaccine -heplisav-B- on 8/18, asked if we can update it in [place] and print a copy of all of her immunizations for her to pick up.

## 2021-10-31 NOTE — Telephone Encounter (Signed)
[  9:49 AM] [place], Illiana Losurdo wants [place] to fax records now  [9:50 [place], Andee Poles  [phone]  [9:50 AM] Beatriz Stallion  does the fax go directly to her?  [9:50 AM] [place], Danielle  yes, its her fax machine

## 2022-01-30 ENCOUNTER — Encounter: Payer: 59 | Admitting: Internal Medicine

## 2022-02-06 ENCOUNTER — Ambulatory Visit (INDEPENDENT_AMBULATORY_CARE_PROVIDER_SITE_OTHER): Payer: 59 | Admitting: Internal Medicine

## 2022-02-06 ENCOUNTER — Encounter: Payer: Self-pay | Admitting: Internal Medicine

## 2022-02-06 VITALS — BP 130/80 | HR 70 | Temp 97.9°F | Ht 61.5 in | Wt 230.0 lb

## 2022-02-06 DIAGNOSIS — Z23 Encounter for immunization: Secondary | ICD-10-CM

## 2022-02-06 DIAGNOSIS — G479 Sleep disorder, unspecified: Secondary | ICD-10-CM | POA: Diagnosis not present

## 2022-02-06 DIAGNOSIS — J4520 Mild intermittent asthma, uncomplicated: Secondary | ICD-10-CM | POA: Diagnosis not present

## 2022-02-06 DIAGNOSIS — Z0000 Encounter for general adult medical examination without abnormal findings: Secondary | ICD-10-CM

## 2022-02-06 DIAGNOSIS — I10 Essential (primary) hypertension: Secondary | ICD-10-CM | POA: Diagnosis not present

## 2022-02-06 MED ORDER — BECLOMETHASONE DIPROP HFA 40 MCG/ACT IN AERB
2.0000 | INHALATION_SPRAY | Freq: Two times a day (BID) | RESPIRATORY_TRACT | 11 refills | Status: DC
  Filled 2022-05-07: qty 10.6, 30d supply, fill #0

## 2022-02-06 NOTE — Assessment & Plan Note (Signed)
May be oversleeping Discussed getting up earlier and doing exercise routine Almost like fibromyalgia type picture---no meds for now

## 2022-02-06 NOTE — Assessment & Plan Note (Signed)
Some dizziness on the labetalol If worse, would consider a change

## 2022-02-06 NOTE — Progress Notes (Signed)
Subjective:    Patient ID: Sherri Blanchard, female    DOB: [birth year], 41 y.o.   MRN: [id number]  HPI Here for physical  Skipping periods One in July--then again this month Went off the OCP after seeing gyn  Feels low energy at times Gets body aches Sleeps well but not always refreshed---awakens at 6:30AM---initiates 9:30PM Not much exercise--just stretches  Some stress at work---but changing jobs Will be starting at [employer] No depression  Checks BP at work--- as high as 148/80's Often 836'O systolic  Current Outpatient Medications on File Prior to Visit  Medication Sig Dispense Refill   albuterol (PROVENTIL) (2.5 MG/3ML) 0.083% nebulizer solution Take 3 mLs (2.5 mg total) by nebulization every 6 (six) hours as needed for wheezing or shortness of breath. 60 mL 1   albuterol (VENTOLIN HFA) 108 (90 Base) MCG/ACT inhaler Inhale 2 puffs into the lungs every 6 (six) hours as needed for wheezing or shortness of breath. 18 g 1   aspirin 81 MG chewable tablet Chew 81 mg by mouth daily.     cetirizine (ZYRTEC) 10 MG tablet Take 10 mg by mouth daily.  11   famotidine (PEPCID) 20 MG tablet Take 20 mg by mouth as needed for heartburn or indigestion.     labetalol (NORMODYNE) 200 MG tablet TAKE 2 TABLETS(400 MG) BY MOUTH TWICE DAILY 360 tablet 2   Multiple Vitamin (MULTIVITAMIN) tablet Take 1 tablet by mouth daily.     No current facility-administered medications on file prior to visit.    Allergies  Allergen Reactions   Amoxicillin-Pot Clavulanate Other (See Comments)    Yeast infection  Yeast infection per pt    Past Medical History:  Diagnosis Date   Allergic rhinitis due to pollen    Hypertension    Infertility, female 73    Past Surgical History:  Procedure Laterality Date   CESAREAN SECTION  2003   CHOLECYSTECTOMY N/A 09/23/2019   Procedure: LAPAROSCOPIC CHOLECYSTECTOMY;  Surgeon: Jesusita Oka, MD;  Location: [facility] OR;  Service: General;  Laterality: N/A;     Family History  Problem Relation Age of Onset   Hypertension Mother    Heart disease Mother        had MI   Heart attack Mother 72       April 2019   Diabetes Father    Hypertension Father    Cancer Father        PROSTATE   Hyperthyroidism Father 70       just DX April 2020   Breast cancer Maternal Grandmother    Cancer Maternal Grandmother        BONE   Cancer Maternal Grandfather        LUNG   Diabetes Paternal Grandmother     Social History   Socioeconomic History   Marital status: Single    Spouse name: Not on file   Number of children: 1   Years of education: Not on file   Highest education level: Not on file  Occupational History   Occupation: CMA    Comment: [employer]  Tobacco Use   Smoking status: Every Day    Packs/day: 0.10    Types: Cigarettes    Passive exposure: Never   Smokeless tobacco: Never  Vaping Use   Vaping Use: Never used  Substance and Sexual Activity   Alcohol use: Yes    Comment: OCC   Drug use: No   Sexual activity: Yes  Partners: Male    Birth control/protection: OCP  Other Topics Concern   Not on file  Social History Narrative   With stable partner since 2011   Social Determinants of Health   Financial Resource Strain: Low Risk  (06/30/2018)   Overall Financial Resource Strain (CARDIA)    Difficulty of Paying Living Expenses: Not hard at all  Food Insecurity: No Food Insecurity (06/30/2018)   Hunger Vital Sign    Worried About Running Out of Food in the Last Year: Never true    [place] in the Last Year: Never true  Transportation Needs: No Transportation Needs (06/30/2018)   PRAPARE - Hydrologist (Medical): No    Lack of Transportation (Non-Medical): No  Physical Activity: Not on file  Stress: Not on file  Social Connections: Not on file  Intimate Partner Violence: Not on file   Review of Systems  Constitutional:  Negative for unexpected weight change.       Wears  seat belt  HENT:  Positive for tinnitus. Negative for dental problem and hearing loss.   Eyes:        Some vision changes--needs reading glasses, etc  Respiratory:  Positive for cough and wheezing. Negative for shortness of breath.        Some increased symptoms with the cold weather Better with albuterol Feels ready for controller med again  Cardiovascular:  Negative for chest pain, palpitations and leg swelling.  Gastrointestinal:  Positive for diarrhea. Negative for blood in stool.       Loose stools --as much as 5-6 times a day Uses imodium prn (if going out)  Endocrine: Negative for polydipsia and polyuria.  Genitourinary:  Negative for dyspareunia, dysuria and hematuria.       Some UTIs--goes to urgent care  Musculoskeletal:  Positive for myalgias. Negative for arthralgias, back pain and joint swelling.  Skin:  Negative for rash.  Allergic/Immunologic: Positive for environmental allergies. Negative for immunocompromised state.       Satisfied with regimen  Neurological:  Positive for dizziness. Negative for syncope and headaches.  Hematological:  Negative for adenopathy. Does not bruise/bleed easily.  Psychiatric/Behavioral:  Negative for dysphoric mood.        Objective:   Physical Exam Constitutional:      Appearance: Normal appearance.  HENT:     Mouth/Throat:     Pharynx: No oropharyngeal exudate or posterior oropharyngeal erythema.  Eyes:     Conjunctiva/sclera: Conjunctivae normal.     Pupils: Pupils are equal, round, and reactive to light.  Cardiovascular:     Rate and Rhythm: Normal rate and regular rhythm.     Pulses: Normal pulses.     Heart sounds: No murmur heard.    No gallop.  Pulmonary:     Effort: Pulmonary effort is normal.     Breath sounds: Normal breath sounds. No wheezing or rales.  Abdominal:     Palpations: Abdomen is soft.     Tenderness: There is no abdominal tenderness.  Musculoskeletal:     Cervical back: Neck supple.     Right lower  leg: No edema.     Left lower leg: No edema.  Lymphadenopathy:     Cervical: No cervical adenopathy.  Skin:    Findings: No lesion or rash.  Neurological:     General: No focal deficit present.     Mental Status: She is alert and oriented to person, place, and time.  Psychiatric:  Mood and Affect: Mood normal.        Behavior: Behavior normal.            Assessment & Plan:

## 2022-02-06 NOTE — Assessment & Plan Note (Signed)
Healthy Discussed exercising more Had flu vaccine 2nd Hep B today Recommended updated COVID vaccine at pharmacy Getting mammogram soon Pap by gyn

## 2022-02-06 NOTE — Addendum Note (Signed)
Addended by: Pilar Grammes on: 02/06/2022 01:12 PM   Modules accepted: Orders

## 2022-02-06 NOTE — Assessment & Plan Note (Signed)
More frequent albuterol need and symptoms Will restart q-var 41mg--- 2 puffs bid

## 2022-02-12 IMAGING — US US BREAST*R* LIMITED INC AXILLA
1 series · 4 of 4 positions shown · non-contrast
Comparison: Previous exams.

CLINICAL DATA: Short-term follow-up for probably benign right
breast mass.

EXAM:
DIGITAL DIAGNOSTIC UNILATERAL RIGHT MAMMOGRAM WITH TOMOSYNTHESIS AND
CAD; ULTRASOUND RIGHT BREAST LIMITED
TECHNIQUE: Right digital diagnostic mammography and breast tomosynthesis was
performed. The images were evaluated with computer-aided detection.;
Targeted ultrasound examination of the right breast was performed

[Series 1: us breast*right* limited inc axilla · 0.07mm/px · 4 acquisitions, 4 frames shown]
[im 1/4]
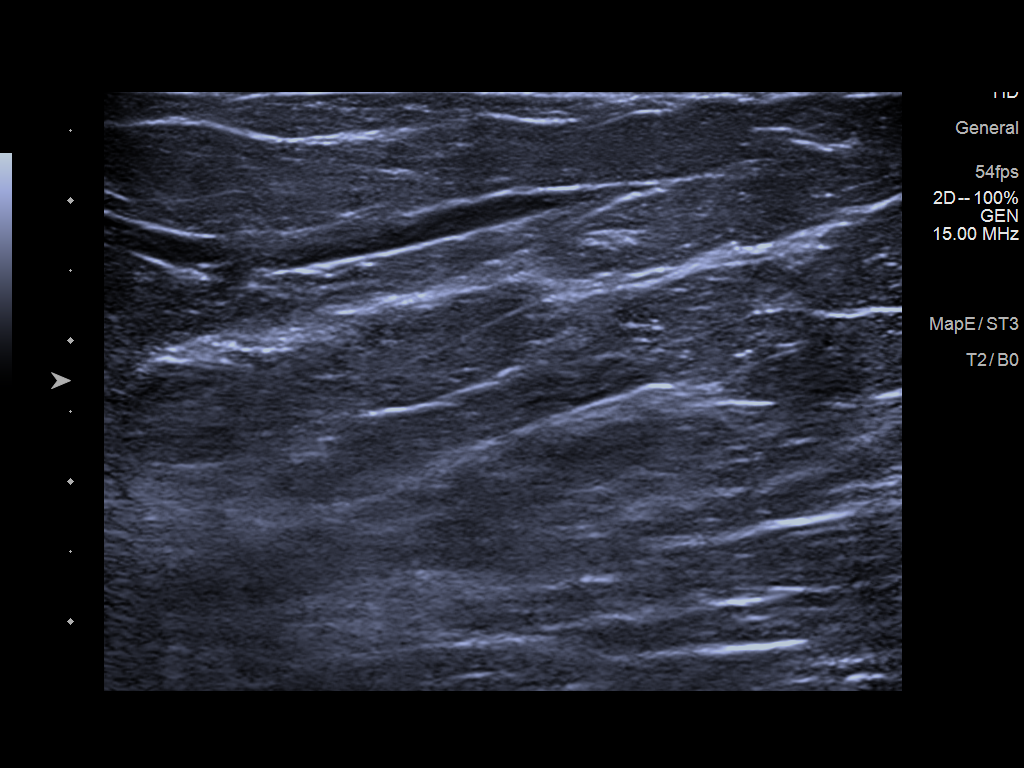
[im 2/4]
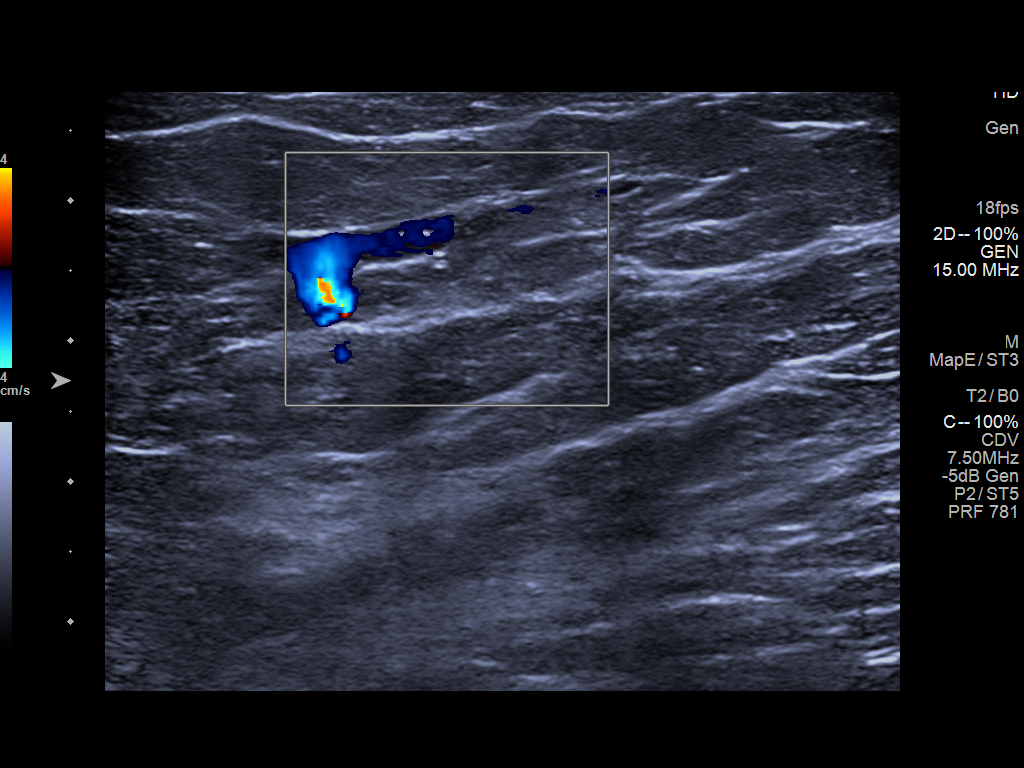
[im 3/4]
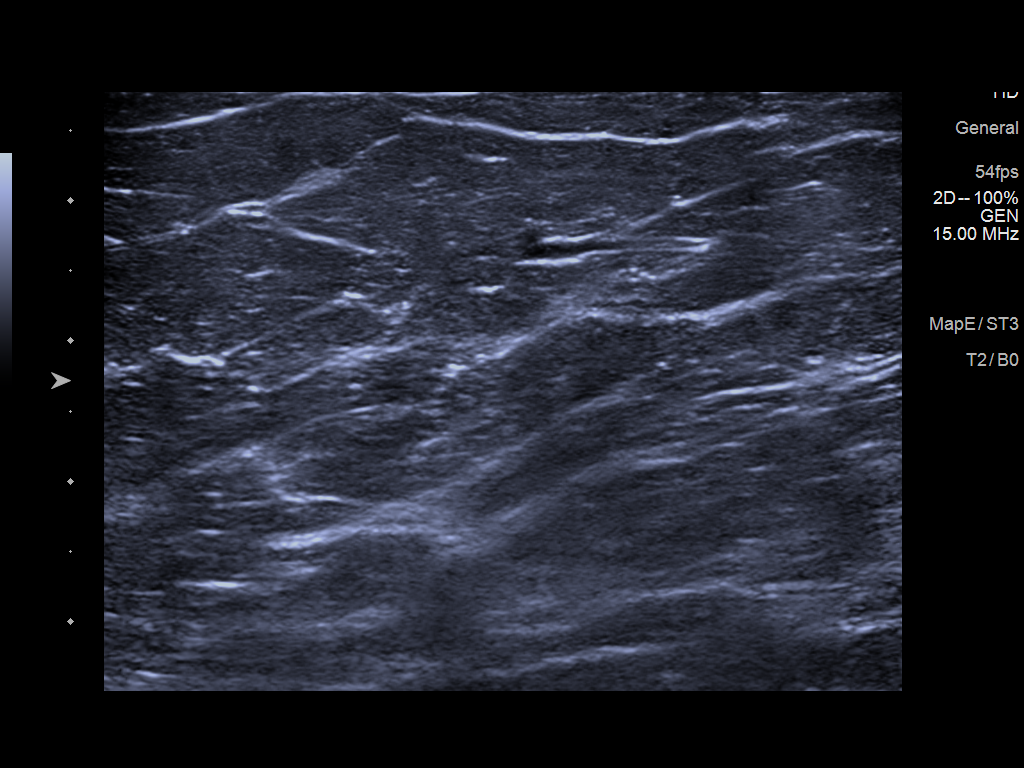
[im 4/4]
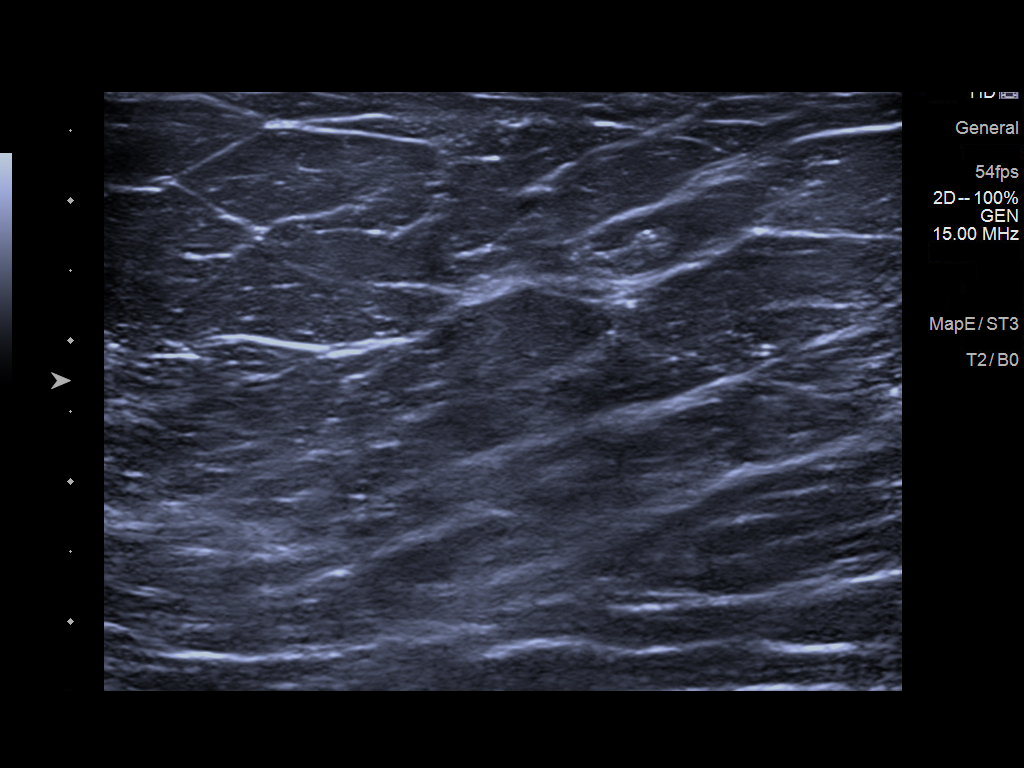

[4 of 4 positions shown; findings below may reference images not displayed]

ACR Breast Density Category b: There are scattered areas of
fibroglandular density.
FINDINGS: No suspicious masses or calcifications seen in the right breast. The
oval circumscribed 0.4 cm mass in the slightly outer right breast
appears stable.

Targeted ultrasound of the right breast was performed. A small blood
vessel is seen in the slightly outer right breast at the 9 o'clock
position, however no sonographic correlate identified for the small
stable mass seen in the right breast at mammography.
IMPRESSION: Stable probably benign right breast mass. No findings of malignancy
in the right breast.

RECOMMENDATION:
Recommend bilateral diagnostic mammography with possible right
breast ultrasound February 2021 which will demonstrate 1 year of
stability of the probably benign right breast mass.

I have discussed the findings and recommendations with the patient.
If applicable, a reminder letter will be sent to the patient
regarding the next appointment.

BI-RADS CATEGORY  3: Probably benign.

## 2022-03-03 ENCOUNTER — Other Ambulatory Visit: Payer: Self-pay | Admitting: Internal Medicine

## 2022-03-12 ENCOUNTER — Encounter: Payer: Self-pay | Admitting: Internal Medicine

## 2022-03-15 ENCOUNTER — Other Ambulatory Visit (HOSPITAL_COMMUNITY): Payer: Self-pay

## 2022-03-15 MED FILL — Labetalol HCl Tab 200 MG: ORAL | 90 days supply | Qty: 360 | Fill #0 | Status: AC

## 2022-05-06 ENCOUNTER — Other Ambulatory Visit (HOSPITAL_COMMUNITY): Payer: Self-pay

## 2022-05-07 ENCOUNTER — Other Ambulatory Visit (HOSPITAL_COMMUNITY): Payer: Self-pay

## 2022-05-23 ENCOUNTER — Other Ambulatory Visit (HOSPITAL_COMMUNITY): Payer: Self-pay

## 2022-05-23 ENCOUNTER — Telehealth: Payer: Self-pay | Admitting: Internal Medicine

## 2022-05-23 MED ORDER — ARNUITY ELLIPTA 100 MCG/ACT IN AEPB
100.0000 ug | INHALATION_SPRAY | Freq: Every day | RESPIRATORY_TRACT | 11 refills | Status: DC
  Filled 2022-05-23: qty 30, 30d supply, fill #0
  Filled 2022-07-19 – 2022-07-31 (×3): qty 30, 30d supply, fill #1
  Filled 2022-10-24: qty 30, 30d supply, fill #2
  Filled 2022-11-27: qty 30, 30d supply, fill #3
  Filled 2023-01-23: qty 30, 30d supply, fill #4
  Filled 2023-05-22: qty 30, 30d supply, fill #5

## 2022-05-23 MED ORDER — ALBUTEROL SULFATE HFA 108 (90 BASE) MCG/ACT IN AERS
2.0000 | INHALATION_SPRAY | Freq: Four times a day (QID) | RESPIRATORY_TRACT | 1 refills | Status: DC | PRN
  Filled 2022-05-23: qty 6.7, 30d supply, fill #0
  Filled 2022-07-29: qty 18, 25d supply, fill #1
  Filled 2023-01-23: qty 6.7, 25d supply, fill #2
  Filled 2023-04-25: qty 18, fill #3

## 2022-05-23 NOTE — Telephone Encounter (Signed)
Left message on VM for pt. 

## 2022-05-23 NOTE — Telephone Encounter (Signed)
...........................  Prescription Request  05/23/2022  LOV: 02/06/2022  What is the name of the medication or equipment? beclomethasone (QVAR) 40 MCG/ACT inhaler  albuterol (VENTOLIN HFA) 108 (90 Base) MCG/ACT inhaler  Have you contacted your pharmacy to request a refill? Yes   Which pharmacy would you like this sent [place]     Patient notified that their request is being sent to the clinical staff for review and that they should receive a response within 2 business days.   Please advise at Mobile [phone] (mobile)

## 2022-05-23 NOTE — Addendum Note (Signed)
Addended by: Viviana Simpler I on: 05/23/2022 04:52 PM   Modules accepted: Orders

## 2022-05-23 NOTE — Telephone Encounter (Signed)
Pt called back stating she spoke to pharmacy & was told her insurance doesn't cover Qvar, but they cover [place]. Call back # [id number]

## 2022-05-23 NOTE — Telephone Encounter (Signed)
Called and spoke to pt. Advised her that the Qvar had refills at [facility] already. She said it was showing in progress but nothing else. I advised her that maybe a PA was needed, but I did not see 1 started in her chart. She will call the pharmacy.

## 2022-06-18 ENCOUNTER — Other Ambulatory Visit (HOSPITAL_COMMUNITY): Payer: Self-pay

## 2022-06-18 MED FILL — Labetalol HCl Tab 200 MG: ORAL | 90 days supply | Qty: 360 | Fill #1 | Status: AC

## 2022-06-26 ENCOUNTER — Encounter (HOSPITAL_COMMUNITY): Payer: Self-pay

## 2022-06-26 ENCOUNTER — Emergency Department (HOSPITAL_COMMUNITY)
Admission: EM | Admit: 2022-06-26 | Discharge: 2022-06-26 | Disposition: A | Discharge status: Home / Self Care | Payer: Commercial Managed Care - PPO | Attending: Emergency Medicine | Admitting: Emergency Medicine

## 2022-06-26 ENCOUNTER — Emergency Department (HOSPITAL_COMMUNITY): Payer: Commercial Managed Care - PPO

## 2022-06-26 DIAGNOSIS — R0602 Shortness of breath: Secondary | ICD-10-CM | POA: Diagnosis not present

## 2022-06-26 DIAGNOSIS — I10 Essential (primary) hypertension: Secondary | ICD-10-CM | POA: Diagnosis not present

## 2022-06-26 LAB — BASIC METABOLIC PANEL
Anion gap: 13 (ref 5–15)
BUN: 9 mg/dL (ref 6–20)
CO2: 19 mmol/L — ABNORMAL LOW (ref 22–32)
Calcium: 8.9 mg/dL (ref 8.9–10.3)
Chloride: 104 mmol/L (ref 98–111)
Creatinine, Ser: 0.75 mg/dL (ref 0.44–1.00)
GFR, Estimated: >60 mL/min (ref >60)
Glucose, Bld: 87 mg/dL (ref 70–99)
Potassium: 3.9 mmol/L (ref 3.5–5.1)
Sodium: 136 mmol/L (ref 135–145)

## 2022-06-26 LAB — CBC
HCT: 40.7 % (ref 36.0–46.0)
Hemoglobin: 13.9 g/dL (ref 12.0–15.0)
MCH: 30.8 pg (ref 26.0–34.0)
MCHC: 34.2 g/dL (ref 30.0–36.0)
MCV: 90.2 fL (ref 80.0–100.0)
Platelets: 300 10*3/uL (ref 150–400)
RBC: 4.51 MIL/uL (ref 3.87–5.11)
RDW: 12.5 % (ref 11.5–15.5)
WBC: 10.1 10*3/uL (ref 4.0–10.5)
nRBC: 0 % (ref 0.0–0.2)

## 2022-06-26 LAB — URINALYSIS, ROUTINE W REFLEX MICROSCOPIC
Bilirubin Urine: NEGATIVE
Glucose, UA: NEGATIVE mg/dL
Ketones, ur: NEGATIVE mg/dL
Leukocytes,Ua: NEGATIVE
Nitrite: NEGATIVE
Protein, ur: NEGATIVE mg/dL
Specific Gravity, Urine: 1.009 (ref 1.005–1.030)
pH: 6 (ref 5.0–8.0)

## 2022-06-26 LAB — TROPONIN I (HIGH SENSITIVITY)
Troponin I (High Sensitivity): 6 ng/L (ref <18)
Troponin I (High Sensitivity): 7 ng/L (ref <18)

## 2022-06-26 MED ORDER — CLONIDINE HCL 0.1 MG PO TABS
0.1000 mg | ORAL_TABLET | Freq: Once | ORAL | Status: AC
  Administered 2022-06-26: 0.1 mg via ORAL
  Filled 2022-06-26: qty 1

## 2022-06-26 MED ORDER — CLONIDINE HCL 0.2 MG PO TABS: 0.2000 mg | ORAL_TABLET | Freq: Once | ORAL | Status: DC

## 2022-06-26 NOTE — ED Provider Notes (Signed)
[place] EMERGENCY DEPARTMENT AT [facility] Provider Note   CSN: [id number] Arrival date & time: 06/26/22  1517     History  Chief Complaint  Patient presents with   Hypertension    Sherri Blanchard is a 42 y.o. female.  With history of hypertension and infertility who presents to the ED for evaluation of hypertension.  She was seen at her gynecologist office earlier today when she was found to have a blood pressure of 186/120.  She was sent here for further evaluation.  She takes labetalol twice daily and has been taking this for the past 2 to 3 years.  She was initially started on this medication because she was attempting to conceive and it was safer than hydrochlorothiazide.  She states she did not have any improvement in her blood pressure with hydrochlorothiazide.  She does not check her blood pressure regularly at home but states that she checks it infrequently while at work and finds her normal pressure to be about 145/90.  She feels at baseline currently.  She states she does have some blurred vision but believes this is because she is not wearing her glasses.  She denies headaches, numbness, weakness, tingling, chest pain, lower extremity swelling.  She reports some mild shortness of breath but attributes this to her asthma.  No recent change to this.   Hypertension       Home Medications Prior to Admission medications   Medication Sig Start Date End Date Taking? Authorizing Provider  albuterol (PROVENTIL) (2.5 MG/3ML) 0.083% nebulizer solution Take 3 mLs (2.5 mg total) by nebulization every 6 (six) hours as needed for wheezing or shortness of breath. 05/04/21   Karie Schwalbe, MD  albuterol (VENTOLIN HFA) 108 (90 Base) MCG/ACT inhaler Inhale 2 puffs into the lungs every 6 (six) hours as needed for wheezing or shortness of breath. 05/23/22   Karie Schwalbe, MD  aspirin 81 MG chewable tablet Chew 81 mg by mouth daily.    [provider]  cetirizine  (ZYRTEC) 10 MG tablet Take 10 mg by mouth daily. 02/22/17   [provider]  famotidine (PEPCID) 20 MG tablet Take 20 mg by mouth as needed for heartburn or indigestion.    [provider]  Fluticasone Furoate (ARNUITY ELLIPTA) 100 MCG/ACT AEPB Inhale 1 puff into the lungs once daily. 05/23/22   Karie Schwalbe, MD  labetalol (NORMODYNE) 200 MG tablet Take 2 tablets (400 mg total) by mouth 2 (two) times daily. 03/12/22   Karie Schwalbe, MD  Multiple Vitamin (MULTIVITAMIN) tablet Take 1 tablet by mouth daily.    [provider]      Allergies    Amoxicillin-pot clavulanate    Review of Systems   Review of Systems  All other systems reviewed and are negative.   Physical Exam Updated Vital Signs BP (!) 186/96   Pulse 62   Temp 98.2 F (36.8 C) (Oral)   Resp 18   Ht  (1.575 m)   Wt 107 kg   SpO2 100%   BMI 43.16 kg/m  Physical Exam Vitals and nursing note reviewed.  Constitutional:      General: She is not in acute distress.    Appearance: Normal appearance. She is well-developed. She is obese. She is not ill-appearing, toxic-appearing or diaphoretic.     Comments: Resting comfortably in bed  HENT:     Head: Normocephalic and atraumatic.  Eyes:     Conjunctiva/sclera: Conjunctivae normal.  Cardiovascular:  Rate and Rhythm: Normal rate and regular rhythm.     Heart sounds: No murmur heard. Pulmonary:     Effort: Pulmonary effort is normal. No respiratory distress.     Breath sounds: Normal breath sounds. No wheezing, rhonchi or rales.  Abdominal:     Palpations: Abdomen is soft.     Tenderness: There is no abdominal tenderness. There is no guarding.  Musculoskeletal:        General: No swelling.     Cervical back: Neck supple.     Right lower leg: No edema.     Left lower leg: No edema.  Skin:    General: Skin is warm and dry.     Capillary Refill: Capillary refill takes less than 2 seconds.  Neurological:     General: No focal  deficit present.     Mental Status: She is alert and oriented to person, place, and time.  Psychiatric:        Mood and Affect: Mood normal.     ED Results / Procedures / Treatments   Labs (all labs ordered are listed, but only abnormal results are displayed) Labs Reviewed  BASIC METABOLIC PANEL - Abnormal; Notable for the following components:      Result Value   CO2 19 (*)    All other components within normal limits  URINALYSIS, ROUTINE W REFLEX MICROSCOPIC - Abnormal; Notable for the following components:   Hgb urine dipstick LARGE (*)    Bacteria, UA RARE (*)    All other components within normal limits  CBC  TROPONIN I (HIGH SENSITIVITY)  TROPONIN I (HIGH SENSITIVITY)    EKG EKG Interpretation  Date/Time:  Wednesday June 26 2022 16:06:05 EDT Ventricular Rate:  77 PR Interval:  180 QRS Duration: 76 QT Interval:  388 QTC Calculation: 439 R Axis:   30 Text Interpretation: Normal sinus rhythm no acute ST/T changes no significant change since 2021 Confirmed by Pricilla Loveless [phone]) on 06/26/2022 9:47:00 PM  Radiology DG Chest 1 View  Result Date: 06/26/2022 CLINICAL DATA:  Shortness of breath EXAM: CHEST  1 VIEW COMPARISON:  None Available. FINDINGS: The heart size and mediastinal contours are within normal limits. Both lungs are clear. The visualized skeletal structures are unremarkable. IMPRESSION: No active disease. Electronically Signed   By: Allegra Lai M.D.   On: 06/26/2022 17:36    Procedures Procedures    Medications Ordered in ED Medications  cloNIDine (CATAPRES) tablet 0.1 mg (0.1 mg Oral Given 06/26/22 2007)    ED Course/ Medical Decision Making/ A&P                             Medical Decision Making Risk Prescription drug management.  This patient presents to the ED for concern of hypertension, this involves an extensive number of treatment options, and is a complaint that carries with it a high risk of complications and morbidity. The  differential diagnosis for hypertension includes but is not limited to hypertensive emergency, hypertensive urgency, stroke, sympathomimetic ingestion, acute pulmonary edema, ischemic stroke, intracranial hemorrhage, preeclampsia or eclampsia, autonomic dysreflexia, acute glomerulonephritis, type I MI, volume overload, urinary obstruction, pain, renal artery stenosis, polycystic kidney disease, Cushing syndrome, OSA, pheochromocytoma, hyperaldosteronism, hypothyroidism, anxiety  Co morbidities that complicate the patient evaluation   hypertension, obesity  My initial workup includes labs, chest x-ray, EKG, clonidine  Additional history obtained from: Nursing notes from this visit.  I ordered, reviewed and interpreted labs which  include: BMP, CBC, urinalysis, troponin, delta troponin.  Labs reassuring  I ordered imaging studies including chest x-ray I independently visualized and interpreted imaging which showed normal I agree with the radiologist interpretation  Afebrile, hypertensive with an average blood pressure of 180/90 despite clonidine in the ED.  Otherwise hemodynamically stable.  42 year old female presents ED for evaluation of hypertension.  She denies all symptoms today.  Lab workup was reassuring and showed no signs of endorgan dysfunction.  She does take labetalol for her blood pressure and reports compliance with this.  She has been taking labetalol because she was trying to get pregnant.  She was never switched to anything else when she stopped trying.  She does not check her blood pressure at home.  She was requesting discharge from the ED stating that the hospital is making her blood pressure even higher.  Believe this is appropriate given lack of symptoms and evidence of endorgan dysfunction.  She states she will call tomorrow to schedule an appointment with her primary care provider for reassessment of her blood pressure.  She was given return precautions.  Stable at  discharge.  At this time there does not appear to be any evidence of an acute emergency medical condition and the patient appears stable for discharge with appropriate outpatient follow up. Diagnosis was discussed with patient who verbalizes understanding of care plan and is agreeable to discharge. I have discussed return precautions with patient who verbalizes understanding. Patient encouraged to follow-up with their PCP within 1 week. All questions answered.  Patient's case discussed with Dr. Criss Alvine who agrees with plan to discharge with follow-up.   Note: Portions of this report may have been transcribed using voice recognition software. Every effort was made to ensure accuracy; however, inadvertent computerized transcription errors may still be present.        Final Clinical Impression(s) / ED Diagnoses Final diagnoses:  Uncontrolled hypertension    Rx / DC Orders ED Discharge Orders     None         Mora Bellman 06/26/22 2203    Pricilla Loveless, MD 06/26/22 2251

## 2022-06-26 NOTE — ED Triage Notes (Signed)
Pt c/o HTN, 190/116, sent by OB GYN for further evaluation;  hx HTN, compliant with meds; endorses some blurred vision,however pt states she normally wears glasses, not wearing today; pt endorses some abd cramping, states she may be starting her period, states it is due but it skips sometimes

## 2022-06-26 NOTE — ED Provider Triage Note (Addendum)
Emergency Medicine Provider Triage Evaluation Note  Sherri Blanchard , a 42 y.o. female  was evaluated in triage.  Pt complains of hypertension.  Was sent by her OB/GYN provider due to elevated blood pressure.  States her vision is somewhat blurry but she is no longer wearing glasses.  Denies headache, denies chest pain.  Is endorsing some abdominal cramping but states she is also started on her period and that she does have a history of uterine fibroids which she states she can sometimes have abdominal cramping associated with her fibroids.  States she has been taking labetalol for at least a couple of years for hypertension.  She is compliant with her medication.  Also mention that she is at times short of breath but this is nothing new for her and attributes it to her asthma.  Review of Systems  Positive: See above Negative: See above  Physical Exam  BP (!) 176/93   Pulse 87   Temp 98.2 F (36.8 C) (Oral)   Resp 16   Ht  (1.575 m)   Wt 107 kg   SpO2 100%   BMI 43.16 kg/m  Gen:   Awake, no distress   Resp:  Normal effort  MSK:   Moves extremities without difficulty  Other:    Medical Decision Making  Medically screening exam initiated at 3:59 PM.  Appropriate orders placed.  Sherri Blanchard was informed that the remainder of the evaluation will be completed by another provider, this initial triage assessment does not replace that evaluation, and the importance of remaining in the ED until their evaluation is complete.  Work up started   Gareth Eagle, PA-C 06/26/22 1600    Gareth Eagle, PA-C 06/26/22 1601    Gareth Eagle, PA-C 06/26/22 [phone]

## 2022-06-26 NOTE — Discharge Instructions (Signed)
You have been seen today for your complaint of hypertension. Your lab work was reassuring. Your discharge medications include your home medications. Follow up with: your PCP as soon as possible.  Please seek immediate medical care if you develop any of the following symptoms: Develop a severe headache or confusion. Have unusual weakness or numbness. Feel faint. Have severe pain in your chest or abdomen. Vomit repeatedly. Have trouble breathing. At this time there does not appear to be the presence of an emergent medical condition, however there is always the potential for conditions to change. Please read and follow the below instructions.  Do not take your medicine if  develop an itchy rash, swelling in your mouth or lips, or difficulty breathing; call 911 and seek immediate emergency medical attention if this occurs.  You may review your lab tests and imaging results in their entirety on your MyChart account.  Please discuss all results of fully with your primary care provider and other specialist at your follow-up visit.  Note: Portions of this text may have been transcribed using voice recognition software. Every effort was made to ensure accuracy; however, inadvertent computerized transcription errors may still be present.

## 2022-06-27 ENCOUNTER — Ambulatory Visit (INDEPENDENT_AMBULATORY_CARE_PROVIDER_SITE_OTHER): Payer: Commercial Managed Care - PPO | Admitting: Internal Medicine

## 2022-06-27 ENCOUNTER — Other Ambulatory Visit (HOSPITAL_COMMUNITY): Payer: Self-pay

## 2022-06-27 ENCOUNTER — Encounter: Payer: Self-pay | Admitting: Internal Medicine

## 2022-06-27 VITALS — BP 162/102 | HR 67 | Temp 97.6°F | Ht 61.5 in | Wt 235.0 lb

## 2022-06-27 DIAGNOSIS — I10 Essential (primary) hypertension: Secondary | ICD-10-CM | POA: Diagnosis not present

## 2022-06-27 MED ORDER — VALSARTAN-HYDROCHLOROTHIAZIDE 160-12.5 MG PO TABS
1.0000 | ORAL_TABLET | Freq: Every day | ORAL | 3 refills | Status: DC
  Filled 2022-06-27: qty 90, 90d supply, fill #0
  Filled 2022-09-24 – 2022-09-25 (×2): qty 90, 90d supply, fill #1
  Filled 2022-12-23: qty 90, 90d supply, fill #2
  Filled 2023-03-17: qty 90, 90d supply, fill #3

## 2022-06-27 NOTE — Progress Notes (Signed)
Subjective:    Patient ID: Sherri Blanchard, female    DOB: 1980/08/19, 42 y.o.   MRN: [id number]  HPI Here for ER follow up  Went to gyn appointment yesterday--very high Sent to ER  No neurologic symptoms No chest pain or SOB  First problems with HTN during pregnancy Stayed on the labetolol due to trying to conceive again (this is over)  Current Outpatient Medications on File Prior to Visit  Medication Sig Dispense Refill   albuterol (PROVENTIL) (2.5 MG/3ML) 0.083% nebulizer solution Take 3 mLs (2.5 mg total) by nebulization every 6 (six) hours as needed for wheezing or shortness of breath. 60 mL 1   albuterol (VENTOLIN HFA) 108 (90 Base) MCG/ACT inhaler Inhale 2 puffs into the lungs every 6 (six) hours as needed for wheezing or shortness of breath. 18 g 1   aspirin 81 MG chewable tablet Chew 81 mg by mouth daily.     cetirizine (ZYRTEC) 10 MG tablet Take 10 mg by mouth daily.  11   famotidine (PEPCID) 20 MG tablet Take 20 mg by mouth as needed for heartburn or indigestion.     Fluticasone Furoate (ARNUITY ELLIPTA) 100 MCG/ACT AEPB Inhale 1 puff into the lungs once daily. 30 each 11   labetalol (NORMODYNE) 200 MG tablet Take 2 tablets (400 mg total) by mouth 2 (two) times daily. 360 tablet 3   Multiple Vitamin (MULTIVITAMIN) tablet Take 1 tablet by mouth daily.     No current facility-administered medications on file prior to visit.    Allergies  Allergen Reactions   Amoxicillin-Pot Clavulanate Other (See Comments)    Yeast infection  Yeast infection per pt    Past Medical History:  Diagnosis Date   Allergic rhinitis due to pollen    Hypertension    Infertility, female 60    Past Surgical History:  Procedure Laterality Date   CESAREAN SECTION  2003   CHOLECYSTECTOMY N/A 09/23/2019   Procedure: LAPAROSCOPIC CHOLECYSTECTOMY;  Surgeon: Diamantina Monks, MD;  Location: [facility] OR;  Service: General;  Laterality: N/A;    Family History  Problem Relation Age of Onset    Hypertension Mother    Heart disease Mother        had MI   Heart attack Mother 72       April 2019   Diabetes Father    Hypertension Father    Cancer Father        PROSTATE   Hyperthyroidism Father 14       just DX April 2020   Breast cancer Maternal Grandmother    Cancer Maternal Grandmother        BONE   Cancer Maternal Grandfather        LUNG   Diabetes Paternal Grandmother     Social History   Socioeconomic History   Marital status: Single    Spouse name: Not on file   Number of children: 1   Years of education: Not on file   Highest education level: Not on file  Occupational History   Occupation: CMA    Comment: [employer]  Tobacco Use   Smoking status: Every Day    Packs/day: .1    Types: Cigarettes    Passive exposure: Never   Smokeless tobacco: Never  Vaping Use   Vaping Use: Never used  Substance and Sexual Activity   Alcohol use: Yes    Comment: OCC   Drug use: No   Sexual activity: Yes    Partners:  Male    Birth control/protection: OCP  Other Topics Concern   Not on file  Social History Narrative   With stable partner since 2011   Social Determinants of Health   Financial Resource Strain: Low Risk  (06/30/2018)   Overall Financial Resource Strain (CARDIA)    Difficulty of Paying Living Expenses: Not hard at all  Food Insecurity: No Food Insecurity (06/30/2018)   Hunger Vital Sign    Worried About Running Out of Food in the Last Year: Never true    Ran Out of Food in the Last Year: Never true  Transportation Needs: No Transportation Needs (06/30/2018)   PRAPARE - Administrator, Civil Service (Medical): No    Lack of Transportation (Non-Medical): No  Physical Activity: Not on file  Stress: Not on file  Social Connections: Not on file  Intimate Partner Violence: Not on file   Review of Systems No edema No history of gout    Objective:   Physical Exam Constitutional:      Appearance: Normal appearance.   Cardiovascular:     Rate and Rhythm: Normal rate and regular rhythm.     Heart sounds: No murmur heard.    No gallop.  Pulmonary:     Effort: Pulmonary effort is normal.     Breath sounds: Normal breath sounds. No wheezing or rales.  Musculoskeletal:     Cervical back: Neck supple.     Right lower leg: No edema.     Left lower leg: No edema.  Lymphadenopathy:     Cervical: No cervical adenopathy.  Neurological:     Mental Status: She is alert.            Assessment & Plan:

## 2022-06-27 NOTE — Assessment & Plan Note (Signed)
BP Readings from Last 3 Encounters:  06/27/22 (!) 162/102  06/26/22 (!) 186/96  02/06/22 130/80   Not adequately controlled on labetalol  bid Will add valsartan 16012.5 She will monitor at work See back 1 month If good response, may be able to stop the labetalol

## 2022-07-19 DIAGNOSIS — D259 Leiomyoma of uterus, unspecified: Secondary | ICD-10-CM | POA: Diagnosis not present

## 2022-07-19 DIAGNOSIS — Z01419 Encounter for gynecological examination (general) (routine) without abnormal findings: Secondary | ICD-10-CM | POA: Diagnosis not present

## 2022-07-19 DIAGNOSIS — Z1231 Encounter for screening mammogram for malignant neoplasm of breast: Secondary | ICD-10-CM | POA: Diagnosis not present

## 2022-07-19 DIAGNOSIS — Z6841 Body Mass Index (BMI) 40.0 and over, adult: Secondary | ICD-10-CM | POA: Diagnosis not present

## 2022-07-19 DIAGNOSIS — N946 Dysmenorrhea, unspecified: Secondary | ICD-10-CM | POA: Diagnosis not present

## 2022-07-27 ENCOUNTER — Other Ambulatory Visit (HOSPITAL_COMMUNITY): Payer: Self-pay

## 2022-07-29 ENCOUNTER — Encounter: Payer: Self-pay | Admitting: Internal Medicine

## 2022-07-29 ENCOUNTER — Ambulatory Visit: Payer: Commercial Managed Care - PPO | Admitting: Internal Medicine

## 2022-07-29 ENCOUNTER — Other Ambulatory Visit (HOSPITAL_COMMUNITY): Payer: Self-pay

## 2022-07-29 VITALS — BP 132/76 | HR 80 | Temp 97.9°F | Ht 61.5 in | Wt 238.0 lb

## 2022-07-29 DIAGNOSIS — I10 Essential (primary) hypertension: Secondary | ICD-10-CM

## 2022-07-29 NOTE — Assessment & Plan Note (Signed)
BP Readings from Last 3 Encounters:  07/29/22 132/76  06/27/22 (!) 162/102  06/26/22 (!) 186/96   Much better in the valsartan/HCTZ 160/12.5 Still on labetalol but may not need it She will monitor BP---okay to stop labetalol if stays low and continue to check Can consider doubling the valsartan/HCTZ if needed

## 2022-07-29 NOTE — Progress Notes (Signed)
Subjective:    Patient ID: Sherri Blanchard, female    DOB: 10-01-1980, 42 y.o.   MRN: [id number]  HPI Here for follow up of HTN  No problems with the new BP medication Will have some mild dizzy feelings---that her BP is low Has been as low as 112/70 Forgot both meds this morning  No chest pain  No SOB No headaches  Current Outpatient Medications on File Prior to Visit  Medication Sig Dispense Refill   albuterol (PROVENTIL) (2.5 MG/3ML) 0.083% nebulizer solution Take 3 mLs (2.5 mg total) by nebulization every 6 (six) hours as needed for wheezing or shortness of breath. 60 mL 1   albuterol (VENTOLIN HFA) 108 (90 Base) MCG/ACT inhaler Inhale 2 puffs into the lungs every 6 (six) hours as needed for wheezing or shortness of breath. 18 g 1   aspirin 81 MG chewable tablet Chew 81 mg by mouth daily.     cetirizine (ZYRTEC) 10 MG tablet Take 10 mg by mouth daily.  11   famotidine (PEPCID) 20 MG tablet Take 20 mg by mouth as needed for heartburn or indigestion.     Fluticasone Furoate (ARNUITY ELLIPTA) 100 MCG/ACT AEPB Inhale 1 puff into the lungs once daily. 30 each 11   labetalol (NORMODYNE) 200 MG tablet Take 2 tablets (400 mg total) by mouth 2 (two) times daily. 360 tablet 3   Multiple Vitamin (MULTIVITAMIN) tablet Take 1 tablet by mouth daily.     valsartan-hydrochlorothiazide (DIOVAN-HCT) 160-12.5 MG tablet Take 1 tablet by mouth daily. 90 tablet 3   No current facility-administered medications on file prior to visit.    Allergies  Allergen Reactions   Amoxicillin-Pot Clavulanate Other (See Comments)    Yeast infection  Yeast infection per pt    Past Medical History:  Diagnosis Date   Allergic rhinitis due to pollen    Hypertension    Infertility, female 23    Past Surgical History:  Procedure Laterality Date   CESAREAN SECTION  2003   CHOLECYSTECTOMY N/A 09/23/2019   Procedure: LAPAROSCOPIC CHOLECYSTECTOMY;  Surgeon: Diamantina Monks, MD;  Location: [facility] OR;  Service:  General;  Laterality: N/A;    Family History  Problem Relation Age of Onset   Hypertension Mother    Heart disease Mother        had MI   Heart attack Mother 72       April 2019   Diabetes Father    Hypertension Father    Cancer Father        PROSTATE   Hyperthyroidism Father 26       just DX April 2020   Breast cancer Maternal Grandmother    Cancer Maternal Grandmother        BONE   Cancer Maternal Grandfather        LUNG   Diabetes Paternal Grandmother     Social History   Socioeconomic History   Marital status: Single    Spouse name: Not on file   Number of children: 1   Years of education: Not on file   Highest education level: Not on file  Occupational History   Occupation: CMA    Comment: [employer]  Tobacco Use   Smoking status: Every Day    Packs/day: .1    Types: Cigarettes    Passive exposure: Never   Smokeless tobacco: Never  Vaping Use   Vaping Use: Never used  Substance and Sexual Activity   Alcohol use: Yes  Comment: OCC   Drug use: No   Sexual activity: Yes    Partners: Male    Birth control/protection: OCP  Other Topics Concern   Not on file  Social History Narrative   With stable partner since 2011   Social Determinants of Health   Financial Resource Strain: Low Risk  (07/29/2022)   Overall Financial Resource Strain (CARDIA)    Difficulty of Paying Living Expenses: Not hard at all  Food Insecurity: No Food Insecurity (07/29/2022)   Hunger Vital Sign    Worried About Running Out of Food in the Last Year: Never true    Ran Out of Food in the Last Year: Never true  Transportation Needs: Patient Declined (07/29/2022)   PRAPARE - Transportation    Lack of Transportation (Medical): Patient declined    Lack of Transportation (Non-Medical): Patient declined  Physical Activity: Insufficiently Active (07/29/2022)   Exercise Vital Sign    Days of Exercise per Week: 2 days    Minutes of Exercise per Session: 30 min  Stress: No  Stress Concern Present (07/29/2022)   [employer] of Occupational Health - Occupational Stress Questionnaire    Feeling of Stress : Only a little  Social Connections: Unknown (07/29/2022)   Social Connection and Isolation Panel [NHANES]    Frequency of Communication with Friends and Family: Patient declined    Frequency of Social Gatherings with Friends and Family: Patient declined    Attends Religious Services: Patient declined    Database administrator or Organizations: Patient declined    Attends Engineer, structural: Not on file    Marital Status: Divorced  Catering manager Violence: Not on file   Review of Systems No cough Stable stomach issues    Objective:   Physical Exam Constitutional:      Appearance: Normal appearance.  Cardiovascular:     Rate and Rhythm: Normal rate and regular rhythm.     Heart sounds: No murmur heard.    No gallop.  Pulmonary:     Effort: Pulmonary effort is normal.     Breath sounds: Normal breath sounds. No wheezing or rales.  Musculoskeletal:     Cervical back: Neck supple.     Right lower leg: No edema.     Left lower leg: No edema.  Lymphadenopathy:     Cervical: No cervical adenopathy.  Neurological:     Mental Status: She is alert.            Assessment & Plan:

## 2022-07-30 ENCOUNTER — Other Ambulatory Visit (HOSPITAL_COMMUNITY): Payer: Self-pay

## 2022-07-30 ENCOUNTER — Encounter: Payer: Self-pay | Admitting: Pharmacist

## 2022-07-30 ENCOUNTER — Other Ambulatory Visit: Payer: Self-pay

## 2022-07-30 LAB — RENAL FUNCTION PANEL
Albumin: 4.1 g/dL (ref 3.5–5.2)
BUN: 14 mg/dL (ref 6–23)
CO2: 28 mEq/L (ref 19–32)
Calcium: 9.5 mg/dL (ref 8.4–10.5)
Chloride: 100 mEq/L (ref 96–112)
Creatinine, Ser: 1.08 mg/dL (ref 0.40–1.20)
GFR: 62.79 mL/min (ref >60.00)
Glucose, Bld: 80 mg/dL (ref 70–99)
Phosphorus: 4 mg/dL (ref 2.3–4.6)
Potassium: 3.9 mEq/L (ref 3.5–5.1)
Sodium: 137 mEq/L (ref 135–145)

## 2022-07-31 ENCOUNTER — Other Ambulatory Visit (HOSPITAL_COMMUNITY): Payer: Self-pay

## 2022-07-31 ENCOUNTER — Other Ambulatory Visit: Payer: Self-pay

## 2022-08-06 ENCOUNTER — Other Ambulatory Visit (HOSPITAL_COMMUNITY): Payer: Self-pay

## 2022-08-14 DIAGNOSIS — D259 Leiomyoma of uterus, unspecified: Secondary | ICD-10-CM | POA: Diagnosis not present

## 2022-08-14 DIAGNOSIS — D251 Intramural leiomyoma of uterus: Secondary | ICD-10-CM | POA: Diagnosis not present

## 2022-08-27 ENCOUNTER — Encounter: Payer: Self-pay | Admitting: Internal Medicine

## 2022-08-28 ENCOUNTER — Other Ambulatory Visit: Payer: Self-pay | Admitting: Internal Medicine

## 2022-09-09 ENCOUNTER — Ambulatory Visit: Payer: Self-pay

## 2022-09-09 ENCOUNTER — Other Ambulatory Visit: Payer: Self-pay | Admitting: Nurse Practitioner

## 2022-09-09 DIAGNOSIS — S93402A Sprain of unspecified ligament of left ankle, initial encounter: Secondary | ICD-10-CM

## 2022-09-24 ENCOUNTER — Other Ambulatory Visit: Payer: Self-pay

## 2022-09-24 ENCOUNTER — Encounter: Payer: Self-pay | Admitting: Pharmacist

## 2022-09-25 ENCOUNTER — Other Ambulatory Visit (HOSPITAL_COMMUNITY): Payer: Self-pay

## 2022-09-26 ENCOUNTER — Other Ambulatory Visit: Payer: Self-pay

## 2022-10-24 ENCOUNTER — Other Ambulatory Visit (HOSPITAL_COMMUNITY): Payer: Self-pay

## 2022-11-27 ENCOUNTER — Other Ambulatory Visit (HOSPITAL_COMMUNITY): Payer: Self-pay

## 2022-11-28 ENCOUNTER — Other Ambulatory Visit (HOSPITAL_COMMUNITY): Payer: Self-pay

## 2022-12-23 ENCOUNTER — Other Ambulatory Visit (HOSPITAL_COMMUNITY): Payer: Self-pay

## 2023-01-23 ENCOUNTER — Other Ambulatory Visit: Payer: Self-pay

## 2023-02-10 ENCOUNTER — Encounter: Payer: Self-pay | Admitting: Internal Medicine

## 2023-02-10 ENCOUNTER — Ambulatory Visit: Payer: Commercial Managed Care - PPO | Admitting: Internal Medicine

## 2023-02-10 ENCOUNTER — Other Ambulatory Visit (HOSPITAL_COMMUNITY): Payer: Self-pay

## 2023-02-10 VITALS — BP 118/80 | HR 87 | Temp 98.6°F | Ht 61.75 in | Wt 238.0 lb

## 2023-02-10 DIAGNOSIS — Z23 Encounter for immunization: Secondary | ICD-10-CM

## 2023-02-10 DIAGNOSIS — K219 Gastro-esophageal reflux disease without esophagitis: Secondary | ICD-10-CM | POA: Diagnosis not present

## 2023-02-10 DIAGNOSIS — J4520 Mild intermittent asthma, uncomplicated: Secondary | ICD-10-CM

## 2023-02-10 DIAGNOSIS — I10 Essential (primary) hypertension: Secondary | ICD-10-CM | POA: Diagnosis not present

## 2023-02-10 DIAGNOSIS — H1045 Other chronic allergic conjunctivitis: Secondary | ICD-10-CM | POA: Diagnosis not present

## 2023-02-10 DIAGNOSIS — F17200 Nicotine dependence, unspecified, uncomplicated: Secondary | ICD-10-CM | POA: Diagnosis not present

## 2023-02-10 DIAGNOSIS — Z0000 Encounter for general adult medical examination without abnormal findings: Secondary | ICD-10-CM

## 2023-02-10 DIAGNOSIS — J3089 Other allergic rhinitis: Secondary | ICD-10-CM | POA: Diagnosis not present

## 2023-02-10 DIAGNOSIS — J4530 Mild persistent asthma, uncomplicated: Secondary | ICD-10-CM | POA: Diagnosis not present

## 2023-02-10 MED ORDER — EPINEPHRINE 0.3 MG/0.3ML IJ SOAJ
INTRAMUSCULAR | 1 refills | Status: AC
  Filled 2023-02-10: qty 2, 30d supply, fill #0

## 2023-02-10 MED ORDER — AZELASTINE HCL 0.05 % OP SOLN
1.0000 [drp] | Freq: Two times a day (BID) | OPHTHALMIC | 3 refills | Status: AC
  Filled 2023-02-10: qty 6, 30d supply, fill #0
  Filled 2023-06-04: qty 6, 30d supply, fill #1
  Filled 2023-08-01 – 2023-11-20 (×2): qty 6, 30d supply, fill #2
  Filled 2024-01-30: qty 6, 30d supply, fill #3

## 2023-02-10 MED ORDER — MONTELUKAST SODIUM 10 MG PO TABS
10.0000 mg | ORAL_TABLET | Freq: Every day | ORAL | 3 refills | Status: DC
  Filled 2023-02-10: qty 30, 30d supply, fill #0
  Filled 2023-04-28: qty 30, 30d supply, fill #1
  Filled 2023-06-04: qty 30, 30d supply, fill #2
  Filled 2023-07-08: qty 30, 30d supply, fill #3

## 2023-02-10 NOTE — Assessment & Plan Note (Signed)
BP Readings from Last 3 Encounters:  02/10/23 118/80  07/29/22 132/76  06/27/22 (!) 162/102   Good control on the valsartan/hydrochlorothiazide 160/12.5mg  Will check labs

## 2023-02-10 NOTE — Progress Notes (Signed)
Subjective:    Patient ID: Sherri Blanchard, female    DOB: [birth year], 42 y.o.   MRN: [id number]  HPI Here for physical  Doing well with the BP medication  Went to allergy doctor today Started on montelukast ----also on cetirizine Will start immunotherapy  Current Outpatient Medications on File Prior to Visit  Medication Sig Dispense Refill   albuterol (PROVENTIL) (2.5 MG/3ML) 0.083% nebulizer solution Take 3 mLs (2.5 mg total) by nebulization every 6 (six) hours as needed for wheezing or shortness of breath. 60 mL 1   albuterol (VENTOLIN HFA) 108 (90 Base) MCG/ACT inhaler Inhale 2 puffs into the lungs every 6 (six) hours as needed for wheezing or shortness of breath. 18 g 1   aspirin 81 MG chewable tablet Chew 81 mg by mouth daily.     cetirizine (ZYRTEC) 10 MG tablet Take 10 mg by mouth daily.  11   famotidine (PEPCID) 20 MG tablet Take 20 mg by mouth as needed for heartburn or indigestion.     Fluticasone Furoate (ARNUITY ELLIPTA) 100 MCG/ACT AEPB Inhale 1 puff into the lungs once daily. 30 each 11   montelukast (SINGULAIR) 10 MG tablet Take 10 mg by mouth at bedtime.     Multiple Vitamin (MULTIVITAMIN) tablet Take 1 tablet by mouth daily.     valsartan-hydrochlorothiazide (DIOVAN-HCT) 160-12.5 MG tablet Take 1 tablet by mouth daily. 90 tablet 3   No current facility-administered medications on file prior to visit.    Allergies  Allergen Reactions   Amoxicillin-Pot Clavulanate Other (See Comments)    Yeast infection  Yeast infection per pt    Past Medical History:  Diagnosis Date   Allergic rhinitis due to pollen    Hypertension    Infertility, female 70    Past Surgical History:  Procedure Laterality Date   CESAREAN SECTION  2003   CHOLECYSTECTOMY N/A 09/23/2019   Procedure: LAPAROSCOPIC CHOLECYSTECTOMY;  Surgeon: Diamantina Monks, MD;  Location: [facility] OR;  Service: General;  Laterality: N/A;    Family History  Problem Relation Age of Onset   Hypertension Mother     Heart disease Mother        had MI   Heart attack Mother 72       April 2019   Diabetes Father    Hypertension Father    Cancer Father        PROSTATE   Hyperthyroidism Father 76       just DX April 2020   Breast cancer Maternal Grandmother    Cancer Maternal Grandmother        BONE   Cancer Maternal Grandfather        LUNG   Diabetes Paternal Grandmother     Social History   Socioeconomic History   Marital status: Single    Spouse name: Not on file   Number of children: 1   Years of education: Not on file   Highest education level: Not on file  Occupational History   Occupation: CMA    Comment: [employer]  Tobacco Use   Smoking status: Every Day    Current packs/day: 0.10    Types: Cigarettes    Passive exposure: Never   Smokeless tobacco: Never  Vaping Use   Vaping status: Never Used  Substance and Sexual Activity   Alcohol use: Yes    Comment: OCC   Drug use: No   Sexual activity: Yes    Partners: Male    Birth control/protection: OCP  Other Topics Concern   Not on file  Social History Narrative   With stable partner since 2011   Social Determinants of Health   Financial Resource Strain: Low Risk  (07/29/2022)   Overall Financial Resource Strain (CARDIA)    Difficulty of Paying Living Expenses: Not hard at all  Food Insecurity: No Food Insecurity (07/29/2022)   Hunger Vital Sign    Worried About Running Out of Food in the Last Year: Never true    Ran Out of Food in the Last Year: Never true  Transportation Needs: Patient Declined (07/29/2022)   PRAPARE - Transportation    Lack of Transportation (Medical): Patient declined    Lack of Transportation (Non-Medical): Patient declined  Physical Activity: Insufficiently Active (07/29/2022)   Exercise Vital Sign    Days of Exercise per Week: 2 days    Minutes of Exercise per Session: 30 min  Stress: No Stress Concern Present (07/29/2022)   [employer] of Occupational Health - Occupational  Stress Questionnaire    Feeling of Stress : Only a little  Social Connections: Unknown (07/29/2022)   Social Connection and Isolation Panel [NHANES]    Frequency of Communication with Friends and Family: Patient declined    Frequency of Social Gatherings with Friends and Family: Patient declined    Attends Religious Services: Patient declined    Database administrator or Organizations: Patient declined    Attends Engineer, structural: Not on file    Marital Status: Divorced  Intimate Partner Violence: Not on file   Review of Systems  Constitutional:        Not exercising Wears seat belt  HENT:  Negative for dental problem and hearing loss.        Vertigo at times---fairly consistent tinnitus Keeps up with dentist  Eyes:  Negative for visual disturbance.       No diplopia or unilateral vision loss  Respiratory:  Negative for cough, chest tightness and shortness of breath.        Wheezing with the fall season--just started montelukast  Cardiovascular:  Negative for chest pain and leg swelling.       Some palpitations---and shakes (with too much caffeine)  Gastrointestinal:  Negative for blood in stool and constipation.       Does have loose stools--relates to having no gallbladder Uses imodium when goes out (like to eat) Some heartburn--uses famotadine prn (and it is effective)  Endocrine: Negative for polydipsia and polyuria.  Genitourinary:  Negative for difficulty urinating, dyspareunia, dysuria and hematuria.       Skips months with periods--uses ibuprofen for pain   Musculoskeletal:  Negative for joint swelling.       Some back and joint pains--mostly just with the cold  Skin:  Negative for rash.  Allergic/Immunologic: Positive for environmental allergies. Negative for immunocompromised state.  Neurological:  Negative for dizziness, syncope, light-headedness and headaches.  Hematological:  Negative for adenopathy. Does not bruise/bleed easily.  Psychiatric/Behavioral:   Negative for dysphoric mood. The patient is not nervous/anxious.        Sleep is variable--some daytime sleepiness then       Objective:   Physical Exam Constitutional:      Appearance: Normal appearance.  HENT:     Mouth/Throat:     Pharynx: No oropharyngeal exudate or posterior oropharyngeal erythema.  Eyes:     Conjunctiva/sclera: Conjunctivae normal.     Pupils: Pupils are equal, round, and reactive to light.  Cardiovascular:     Rate  and Rhythm: Normal rate and regular rhythm.     Pulses: Normal pulses.     Heart sounds: No murmur heard.    No gallop.  Pulmonary:     Effort: Pulmonary effort is normal.     Breath sounds: Normal breath sounds. No wheezing or rales.  Abdominal:     Palpations: Abdomen is soft.     Tenderness: There is no abdominal tenderness.  Musculoskeletal:     Cervical back: Neck supple.     Right lower leg: No edema.     Left lower leg: No edema.  Lymphadenopathy:     Cervical: No cervical adenopathy.  Skin:    Findings: No rash.  Neurological:     General: No focal deficit present.     Mental Status: She is alert and oriented to person, place, and time.  Psychiatric:        Mood and Affect: Mood normal.        Behavior: Behavior normal.            Assessment & Plan:

## 2023-02-10 NOTE — Assessment & Plan Note (Signed)
Some seasonal worsening Just started montelukast Starting immunotherapy

## 2023-02-10 NOTE — Addendum Note (Signed)
Addended by: Eual Fines on: 02/10/2023 02:52 PM   Modules accepted: Orders

## 2023-02-10 NOTE — Assessment & Plan Note (Signed)
Uses famotidine prn

## 2023-02-10 NOTE — Assessment & Plan Note (Signed)
Healthy but needs to work on fitness Td today Had flu vaccine--and will get COVID vaccine at pharmacy Start colonoscopies next year Recent mammogram at gyn--and pap

## 2023-02-11 LAB — COMPREHENSIVE METABOLIC PANEL
ALT: 14 U/L (ref 0–35)
AST: 14 U/L (ref 0–37)
Albumin: 4.5 g/dL (ref 3.5–5.2)
Alkaline Phosphatase: 98 U/L (ref 39–117)
BUN: 11 mg/dL (ref 6–23)
CO2: 30 meq/L (ref 19–32)
Calcium: 9.7 mg/dL (ref 8.4–10.5)
Chloride: 99 meq/L (ref 96–112)
Creatinine, Ser: 0.85 mg/dL (ref 0.40–1.20)
GFR: 83.38 mL/min (ref >60.00)
Glucose, Bld: 86 mg/dL (ref 70–99)
Potassium: 3.7 meq/L (ref 3.5–5.1)
Sodium: 138 meq/L (ref 135–145)
Total Bilirubin: 0.3 mg/dL (ref 0.2–1.2)
Total Protein: 7.7 g/dL (ref 6.0–8.3)

## 2023-02-11 LAB — CBC
HCT: 45.4 % (ref 36.0–46.0)
Hemoglobin: 15.1 g/dL — ABNORMAL HIGH (ref 12.0–15.0)
MCHC: 33.3 g/dL (ref 30.0–36.0)
MCV: 90.9 fL (ref 78.0–100.0)
Platelets: 358 10*3/uL (ref 150.0–400.0)
RBC: 4.99 Mil/uL (ref 3.87–5.11)
RDW: 12.9 % (ref 11.5–15.5)
WBC: 14.2 10*3/uL — ABNORMAL HIGH (ref 4.0–10.5)

## 2023-02-18 DIAGNOSIS — J301 Allergic rhinitis due to pollen: Secondary | ICD-10-CM | POA: Diagnosis not present

## 2023-02-19 DIAGNOSIS — J3089 Other allergic rhinitis: Secondary | ICD-10-CM | POA: Diagnosis not present

## 2023-02-19 DIAGNOSIS — J3081 Allergic rhinitis due to animal (cat) (dog) hair and dander: Secondary | ICD-10-CM | POA: Diagnosis not present

## 2023-03-13 DIAGNOSIS — J301 Allergic rhinitis due to pollen: Secondary | ICD-10-CM | POA: Diagnosis not present

## 2023-03-13 DIAGNOSIS — J3089 Other allergic rhinitis: Secondary | ICD-10-CM | POA: Diagnosis not present

## 2023-03-13 DIAGNOSIS — J3081 Allergic rhinitis due to animal (cat) (dog) hair and dander: Secondary | ICD-10-CM | POA: Diagnosis not present

## 2023-03-17 ENCOUNTER — Other Ambulatory Visit: Payer: Self-pay

## 2023-03-20 DIAGNOSIS — J301 Allergic rhinitis due to pollen: Secondary | ICD-10-CM | POA: Diagnosis not present

## 2023-03-20 DIAGNOSIS — J3081 Allergic rhinitis due to animal (cat) (dog) hair and dander: Secondary | ICD-10-CM | POA: Diagnosis not present

## 2023-03-20 DIAGNOSIS — J3089 Other allergic rhinitis: Secondary | ICD-10-CM | POA: Diagnosis not present

## 2023-03-25 DIAGNOSIS — J3089 Other allergic rhinitis: Secondary | ICD-10-CM | POA: Diagnosis not present

## 2023-03-25 DIAGNOSIS — J301 Allergic rhinitis due to pollen: Secondary | ICD-10-CM | POA: Diagnosis not present

## 2023-03-25 DIAGNOSIS — J3081 Allergic rhinitis due to animal (cat) (dog) hair and dander: Secondary | ICD-10-CM | POA: Diagnosis not present

## 2023-04-01 DIAGNOSIS — J301 Allergic rhinitis due to pollen: Secondary | ICD-10-CM | POA: Diagnosis not present

## 2023-04-01 DIAGNOSIS — J3081 Allergic rhinitis due to animal (cat) (dog) hair and dander: Secondary | ICD-10-CM | POA: Diagnosis not present

## 2023-04-01 DIAGNOSIS — J3089 Other allergic rhinitis: Secondary | ICD-10-CM | POA: Diagnosis not present

## 2023-04-03 DIAGNOSIS — J3081 Allergic rhinitis due to animal (cat) (dog) hair and dander: Secondary | ICD-10-CM | POA: Diagnosis not present

## 2023-04-03 DIAGNOSIS — J301 Allergic rhinitis due to pollen: Secondary | ICD-10-CM | POA: Diagnosis not present

## 2023-04-03 DIAGNOSIS — J3089 Other allergic rhinitis: Secondary | ICD-10-CM | POA: Diagnosis not present

## 2023-04-09 DIAGNOSIS — J301 Allergic rhinitis due to pollen: Secondary | ICD-10-CM | POA: Diagnosis not present

## 2023-04-09 DIAGNOSIS — J3081 Allergic rhinitis due to animal (cat) (dog) hair and dander: Secondary | ICD-10-CM | POA: Diagnosis not present

## 2023-04-09 DIAGNOSIS — J3089 Other allergic rhinitis: Secondary | ICD-10-CM | POA: Diagnosis not present

## 2023-04-11 DIAGNOSIS — J3089 Other allergic rhinitis: Secondary | ICD-10-CM | POA: Diagnosis not present

## 2023-04-11 DIAGNOSIS — J3081 Allergic rhinitis due to animal (cat) (dog) hair and dander: Secondary | ICD-10-CM | POA: Diagnosis not present

## 2023-04-11 DIAGNOSIS — J301 Allergic rhinitis due to pollen: Secondary | ICD-10-CM | POA: Diagnosis not present

## 2023-04-16 DIAGNOSIS — J3081 Allergic rhinitis due to animal (cat) (dog) hair and dander: Secondary | ICD-10-CM | POA: Diagnosis not present

## 2023-04-16 DIAGNOSIS — J301 Allergic rhinitis due to pollen: Secondary | ICD-10-CM | POA: Diagnosis not present

## 2023-04-16 DIAGNOSIS — J3089 Other allergic rhinitis: Secondary | ICD-10-CM | POA: Diagnosis not present

## 2023-04-18 DIAGNOSIS — J3081 Allergic rhinitis due to animal (cat) (dog) hair and dander: Secondary | ICD-10-CM | POA: Diagnosis not present

## 2023-04-18 DIAGNOSIS — J3089 Other allergic rhinitis: Secondary | ICD-10-CM | POA: Diagnosis not present

## 2023-04-18 DIAGNOSIS — J301 Allergic rhinitis due to pollen: Secondary | ICD-10-CM | POA: Diagnosis not present

## 2023-04-22 DIAGNOSIS — J3089 Other allergic rhinitis: Secondary | ICD-10-CM | POA: Diagnosis not present

## 2023-04-22 DIAGNOSIS — J301 Allergic rhinitis due to pollen: Secondary | ICD-10-CM | POA: Diagnosis not present

## 2023-04-22 DIAGNOSIS — J3081 Allergic rhinitis due to animal (cat) (dog) hair and dander: Secondary | ICD-10-CM | POA: Diagnosis not present

## 2023-04-25 ENCOUNTER — Other Ambulatory Visit: Payer: Self-pay | Admitting: Internal Medicine

## 2023-04-25 ENCOUNTER — Other Ambulatory Visit: Payer: Self-pay

## 2023-04-25 ENCOUNTER — Other Ambulatory Visit (HOSPITAL_COMMUNITY): Payer: Self-pay

## 2023-04-27 MED ORDER — ALBUTEROL SULFATE HFA 108 (90 BASE) MCG/ACT IN AERS
2.0000 | INHALATION_SPRAY | Freq: Four times a day (QID) | RESPIRATORY_TRACT | 0 refills | Status: DC | PRN
  Filled 2023-04-27: qty 6.7, 25d supply, fill #0

## 2023-04-28 ENCOUNTER — Other Ambulatory Visit (HOSPITAL_COMMUNITY): Payer: Self-pay

## 2023-04-28 ENCOUNTER — Other Ambulatory Visit: Payer: Self-pay

## 2023-04-28 DIAGNOSIS — J3081 Allergic rhinitis due to animal (cat) (dog) hair and dander: Secondary | ICD-10-CM | POA: Diagnosis not present

## 2023-04-28 DIAGNOSIS — J3089 Other allergic rhinitis: Secondary | ICD-10-CM | POA: Diagnosis not present

## 2023-04-28 DIAGNOSIS — J301 Allergic rhinitis due to pollen: Secondary | ICD-10-CM | POA: Diagnosis not present

## 2023-05-06 DIAGNOSIS — J301 Allergic rhinitis due to pollen: Secondary | ICD-10-CM | POA: Diagnosis not present

## 2023-05-06 DIAGNOSIS — J3089 Other allergic rhinitis: Secondary | ICD-10-CM | POA: Diagnosis not present

## 2023-05-06 DIAGNOSIS — J3081 Allergic rhinitis due to animal (cat) (dog) hair and dander: Secondary | ICD-10-CM | POA: Diagnosis not present

## 2023-05-19 DIAGNOSIS — J301 Allergic rhinitis due to pollen: Secondary | ICD-10-CM | POA: Diagnosis not present

## 2023-05-19 DIAGNOSIS — J3089 Other allergic rhinitis: Secondary | ICD-10-CM | POA: Diagnosis not present

## 2023-05-19 DIAGNOSIS — J3081 Allergic rhinitis due to animal (cat) (dog) hair and dander: Secondary | ICD-10-CM | POA: Diagnosis not present

## 2023-05-22 ENCOUNTER — Other Ambulatory Visit (HOSPITAL_COMMUNITY): Payer: Self-pay

## 2023-05-28 DIAGNOSIS — J301 Allergic rhinitis due to pollen: Secondary | ICD-10-CM | POA: Diagnosis not present

## 2023-05-28 DIAGNOSIS — J3081 Allergic rhinitis due to animal (cat) (dog) hair and dander: Secondary | ICD-10-CM | POA: Diagnosis not present

## 2023-05-28 DIAGNOSIS — J3089 Other allergic rhinitis: Secondary | ICD-10-CM | POA: Diagnosis not present

## 2023-06-04 ENCOUNTER — Other Ambulatory Visit (HOSPITAL_COMMUNITY): Payer: Self-pay

## 2023-06-04 ENCOUNTER — Other Ambulatory Visit: Payer: Self-pay

## 2023-06-04 ENCOUNTER — Other Ambulatory Visit: Payer: Self-pay | Admitting: Internal Medicine

## 2023-06-04 DIAGNOSIS — J3089 Other allergic rhinitis: Secondary | ICD-10-CM | POA: Diagnosis not present

## 2023-06-04 DIAGNOSIS — J301 Allergic rhinitis due to pollen: Secondary | ICD-10-CM | POA: Diagnosis not present

## 2023-06-04 DIAGNOSIS — J3081 Allergic rhinitis due to animal (cat) (dog) hair and dander: Secondary | ICD-10-CM | POA: Diagnosis not present

## 2023-06-04 MED ORDER — ALBUTEROL SULFATE HFA 108 (90 BASE) MCG/ACT IN AERS
2.0000 | INHALATION_SPRAY | Freq: Four times a day (QID) | RESPIRATORY_TRACT | 0 refills | Status: DC | PRN
  Filled 2023-06-04: qty 6.7, 25d supply, fill #0

## 2023-06-04 MED ORDER — VALSARTAN-HYDROCHLOROTHIAZIDE 160-12.5 MG PO TABS
1.0000 | ORAL_TABLET | Freq: Every day | ORAL | 3 refills | Status: AC
  Filled 2023-06-04: qty 90, 90d supply, fill #0
  Filled 2023-09-09: qty 90, 90d supply, fill #1
  Filled 2023-12-12: qty 90, 90d supply, fill #2
  Filled 2024-03-24: qty 90, 90d supply, fill #3

## 2023-06-09 DIAGNOSIS — J3081 Allergic rhinitis due to animal (cat) (dog) hair and dander: Secondary | ICD-10-CM | POA: Diagnosis not present

## 2023-06-09 DIAGNOSIS — J3089 Other allergic rhinitis: Secondary | ICD-10-CM | POA: Diagnosis not present

## 2023-06-09 DIAGNOSIS — J301 Allergic rhinitis due to pollen: Secondary | ICD-10-CM | POA: Diagnosis not present

## 2023-06-11 DIAGNOSIS — J301 Allergic rhinitis due to pollen: Secondary | ICD-10-CM | POA: Diagnosis not present

## 2023-06-11 DIAGNOSIS — J3081 Allergic rhinitis due to animal (cat) (dog) hair and dander: Secondary | ICD-10-CM | POA: Diagnosis not present

## 2023-06-11 DIAGNOSIS — J3089 Other allergic rhinitis: Secondary | ICD-10-CM | POA: Diagnosis not present

## 2023-06-20 DIAGNOSIS — J3089 Other allergic rhinitis: Secondary | ICD-10-CM | POA: Diagnosis not present

## 2023-06-20 DIAGNOSIS — J3081 Allergic rhinitis due to animal (cat) (dog) hair and dander: Secondary | ICD-10-CM | POA: Diagnosis not present

## 2023-06-20 DIAGNOSIS — J301 Allergic rhinitis due to pollen: Secondary | ICD-10-CM | POA: Diagnosis not present

## 2023-06-30 DIAGNOSIS — J301 Allergic rhinitis due to pollen: Secondary | ICD-10-CM | POA: Diagnosis not present

## 2023-06-30 DIAGNOSIS — J3081 Allergic rhinitis due to animal (cat) (dog) hair and dander: Secondary | ICD-10-CM | POA: Diagnosis not present

## 2023-06-30 DIAGNOSIS — J3089 Other allergic rhinitis: Secondary | ICD-10-CM | POA: Diagnosis not present

## 2023-07-07 DIAGNOSIS — J3089 Other allergic rhinitis: Secondary | ICD-10-CM | POA: Diagnosis not present

## 2023-07-07 DIAGNOSIS — J301 Allergic rhinitis due to pollen: Secondary | ICD-10-CM | POA: Diagnosis not present

## 2023-07-07 DIAGNOSIS — J3081 Allergic rhinitis due to animal (cat) (dog) hair and dander: Secondary | ICD-10-CM | POA: Diagnosis not present

## 2023-07-08 ENCOUNTER — Other Ambulatory Visit (HOSPITAL_COMMUNITY): Payer: Self-pay

## 2023-07-14 DIAGNOSIS — J301 Allergic rhinitis due to pollen: Secondary | ICD-10-CM | POA: Diagnosis not present

## 2023-07-14 DIAGNOSIS — J3089 Other allergic rhinitis: Secondary | ICD-10-CM | POA: Diagnosis not present

## 2023-07-14 DIAGNOSIS — J3081 Allergic rhinitis due to animal (cat) (dog) hair and dander: Secondary | ICD-10-CM | POA: Diagnosis not present

## 2023-07-22 DIAGNOSIS — J301 Allergic rhinitis due to pollen: Secondary | ICD-10-CM | POA: Diagnosis not present

## 2023-07-22 DIAGNOSIS — J3089 Other allergic rhinitis: Secondary | ICD-10-CM | POA: Diagnosis not present

## 2023-07-22 DIAGNOSIS — J3081 Allergic rhinitis due to animal (cat) (dog) hair and dander: Secondary | ICD-10-CM | POA: Diagnosis not present

## 2023-07-29 DIAGNOSIS — J3081 Allergic rhinitis due to animal (cat) (dog) hair and dander: Secondary | ICD-10-CM | POA: Diagnosis not present

## 2023-07-29 DIAGNOSIS — J301 Allergic rhinitis due to pollen: Secondary | ICD-10-CM | POA: Diagnosis not present

## 2023-07-29 DIAGNOSIS — J3089 Other allergic rhinitis: Secondary | ICD-10-CM | POA: Diagnosis not present

## 2023-08-01 ENCOUNTER — Other Ambulatory Visit: Payer: Self-pay | Admitting: Internal Medicine

## 2023-08-01 ENCOUNTER — Other Ambulatory Visit (HOSPITAL_COMMUNITY): Payer: Self-pay

## 2023-08-01 ENCOUNTER — Other Ambulatory Visit: Payer: Self-pay

## 2023-08-01 MED ORDER — ALBUTEROL SULFATE HFA 108 (90 BASE) MCG/ACT IN AERS
2.0000 | INHALATION_SPRAY | Freq: Four times a day (QID) | RESPIRATORY_TRACT | 0 refills | Status: DC | PRN
  Filled 2023-08-01: qty 6.7, 25d supply, fill #0

## 2023-08-04 ENCOUNTER — Other Ambulatory Visit (HOSPITAL_COMMUNITY): Payer: Self-pay

## 2023-08-04 MED ORDER — MONTELUKAST SODIUM 10 MG PO TABS
10.0000 mg | ORAL_TABLET | Freq: Every day | ORAL | 1 refills | Status: DC
  Filled 2023-08-04: qty 30, 30d supply, fill #0
  Filled 2023-09-09: qty 30, 30d supply, fill #1

## 2023-08-05 ENCOUNTER — Other Ambulatory Visit: Payer: Self-pay

## 2023-08-05 DIAGNOSIS — J301 Allergic rhinitis due to pollen: Secondary | ICD-10-CM | POA: Diagnosis not present

## 2023-08-05 DIAGNOSIS — J3089 Other allergic rhinitis: Secondary | ICD-10-CM | POA: Diagnosis not present

## 2023-08-05 DIAGNOSIS — J3081 Allergic rhinitis due to animal (cat) (dog) hair and dander: Secondary | ICD-10-CM | POA: Diagnosis not present

## 2023-08-07 ENCOUNTER — Other Ambulatory Visit: Payer: Self-pay

## 2023-08-11 DIAGNOSIS — J301 Allergic rhinitis due to pollen: Secondary | ICD-10-CM | POA: Diagnosis not present

## 2023-08-11 DIAGNOSIS — H1045 Other chronic allergic conjunctivitis: Secondary | ICD-10-CM | POA: Diagnosis not present

## 2023-08-11 DIAGNOSIS — J3089 Other allergic rhinitis: Secondary | ICD-10-CM | POA: Diagnosis not present

## 2023-08-11 DIAGNOSIS — J4530 Mild persistent asthma, uncomplicated: Secondary | ICD-10-CM | POA: Diagnosis not present

## 2023-08-11 DIAGNOSIS — F17200 Nicotine dependence, unspecified, uncomplicated: Secondary | ICD-10-CM | POA: Diagnosis not present

## 2023-08-11 DIAGNOSIS — J3081 Allergic rhinitis due to animal (cat) (dog) hair and dander: Secondary | ICD-10-CM | POA: Diagnosis not present

## 2023-08-26 DIAGNOSIS — J3089 Other allergic rhinitis: Secondary | ICD-10-CM | POA: Diagnosis not present

## 2023-08-26 DIAGNOSIS — J301 Allergic rhinitis due to pollen: Secondary | ICD-10-CM | POA: Diagnosis not present

## 2023-08-26 DIAGNOSIS — J3081 Allergic rhinitis due to animal (cat) (dog) hair and dander: Secondary | ICD-10-CM | POA: Diagnosis not present

## 2023-09-03 DIAGNOSIS — D259 Leiomyoma of uterus, unspecified: Secondary | ICD-10-CM | POA: Diagnosis not present

## 2023-09-03 DIAGNOSIS — J301 Allergic rhinitis due to pollen: Secondary | ICD-10-CM | POA: Diagnosis not present

## 2023-09-03 DIAGNOSIS — Z803 Family history of malignant neoplasm of breast: Secondary | ICD-10-CM | POA: Diagnosis not present

## 2023-09-03 DIAGNOSIS — J3089 Other allergic rhinitis: Secondary | ICD-10-CM | POA: Diagnosis not present

## 2023-09-03 DIAGNOSIS — Z1231 Encounter for screening mammogram for malignant neoplasm of breast: Secondary | ICD-10-CM | POA: Diagnosis not present

## 2023-09-03 DIAGNOSIS — Z801 Family history of malignant neoplasm of trachea, bronchus and lung: Secondary | ICD-10-CM | POA: Diagnosis not present

## 2023-09-03 DIAGNOSIS — Z800 Family history of malignant neoplasm of digestive organs: Secondary | ICD-10-CM | POA: Diagnosis not present

## 2023-09-03 DIAGNOSIS — Z6841 Body Mass Index (BMI) 40.0 and over, adult: Secondary | ICD-10-CM | POA: Diagnosis not present

## 2023-09-03 DIAGNOSIS — Z01419 Encounter for gynecological examination (general) (routine) without abnormal findings: Secondary | ICD-10-CM | POA: Diagnosis not present

## 2023-09-03 DIAGNOSIS — J3081 Allergic rhinitis due to animal (cat) (dog) hair and dander: Secondary | ICD-10-CM | POA: Diagnosis not present

## 2023-09-03 DIAGNOSIS — Z8042 Family history of malignant neoplasm of prostate: Secondary | ICD-10-CM | POA: Diagnosis not present

## 2023-09-09 ENCOUNTER — Other Ambulatory Visit: Payer: Self-pay

## 2023-09-09 DIAGNOSIS — J3089 Other allergic rhinitis: Secondary | ICD-10-CM | POA: Diagnosis not present

## 2023-09-09 DIAGNOSIS — J301 Allergic rhinitis due to pollen: Secondary | ICD-10-CM | POA: Diagnosis not present

## 2023-09-09 DIAGNOSIS — J3081 Allergic rhinitis due to animal (cat) (dog) hair and dander: Secondary | ICD-10-CM | POA: Diagnosis not present

## 2023-09-16 DIAGNOSIS — J3089 Other allergic rhinitis: Secondary | ICD-10-CM | POA: Diagnosis not present

## 2023-09-16 DIAGNOSIS — J3081 Allergic rhinitis due to animal (cat) (dog) hair and dander: Secondary | ICD-10-CM | POA: Diagnosis not present

## 2023-09-16 DIAGNOSIS — J301 Allergic rhinitis due to pollen: Secondary | ICD-10-CM | POA: Diagnosis not present

## 2023-09-25 DIAGNOSIS — J301 Allergic rhinitis due to pollen: Secondary | ICD-10-CM | POA: Diagnosis not present

## 2023-09-25 DIAGNOSIS — J3081 Allergic rhinitis due to animal (cat) (dog) hair and dander: Secondary | ICD-10-CM | POA: Diagnosis not present

## 2023-09-25 DIAGNOSIS — J3089 Other allergic rhinitis: Secondary | ICD-10-CM | POA: Diagnosis not present

## 2023-10-03 DIAGNOSIS — J3089 Other allergic rhinitis: Secondary | ICD-10-CM | POA: Diagnosis not present

## 2023-10-03 DIAGNOSIS — J301 Allergic rhinitis due to pollen: Secondary | ICD-10-CM | POA: Diagnosis not present

## 2023-10-03 DIAGNOSIS — J3081 Allergic rhinitis due to animal (cat) (dog) hair and dander: Secondary | ICD-10-CM | POA: Diagnosis not present

## 2023-10-09 DIAGNOSIS — J301 Allergic rhinitis due to pollen: Secondary | ICD-10-CM | POA: Diagnosis not present

## 2023-10-09 DIAGNOSIS — J3089 Other allergic rhinitis: Secondary | ICD-10-CM | POA: Diagnosis not present

## 2023-10-09 DIAGNOSIS — J3081 Allergic rhinitis due to animal (cat) (dog) hair and dander: Secondary | ICD-10-CM | POA: Diagnosis not present

## 2023-10-14 ENCOUNTER — Other Ambulatory Visit: Payer: Self-pay | Admitting: Internal Medicine

## 2023-10-14 ENCOUNTER — Other Ambulatory Visit: Payer: Self-pay

## 2023-10-14 ENCOUNTER — Other Ambulatory Visit (HOSPITAL_COMMUNITY): Payer: Self-pay

## 2023-10-14 MED ORDER — MONTELUKAST SODIUM 10 MG PO TABS
10.0000 mg | ORAL_TABLET | Freq: Every day | ORAL | 3 refills | Status: DC
  Filled 2023-10-14: qty 30, 30d supply, fill #0
  Filled 2023-11-20: qty 30, 30d supply, fill #1
  Filled 2023-12-12: qty 30, 30d supply, fill #2
  Filled 2024-01-30: qty 30, 30d supply, fill #3

## 2023-10-14 MED ORDER — ALBUTEROL SULFATE HFA 108 (90 BASE) MCG/ACT IN AERS
2.0000 | INHALATION_SPRAY | Freq: Four times a day (QID) | RESPIRATORY_TRACT | 1 refills | Status: AC | PRN
  Filled 2023-10-14: qty 6.7, 25d supply, fill #0

## 2023-10-20 DIAGNOSIS — J301 Allergic rhinitis due to pollen: Secondary | ICD-10-CM | POA: Diagnosis not present

## 2023-10-20 DIAGNOSIS — J3089 Other allergic rhinitis: Secondary | ICD-10-CM | POA: Diagnosis not present

## 2023-10-20 DIAGNOSIS — J3081 Allergic rhinitis due to animal (cat) (dog) hair and dander: Secondary | ICD-10-CM | POA: Diagnosis not present

## 2023-10-31 DIAGNOSIS — J301 Allergic rhinitis due to pollen: Secondary | ICD-10-CM | POA: Diagnosis not present

## 2023-10-31 DIAGNOSIS — J3089 Other allergic rhinitis: Secondary | ICD-10-CM | POA: Diagnosis not present

## 2023-10-31 DIAGNOSIS — J3081 Allergic rhinitis due to animal (cat) (dog) hair and dander: Secondary | ICD-10-CM | POA: Diagnosis not present

## 2023-11-05 DIAGNOSIS — J301 Allergic rhinitis due to pollen: Secondary | ICD-10-CM | POA: Diagnosis not present

## 2023-11-05 DIAGNOSIS — J3081 Allergic rhinitis due to animal (cat) (dog) hair and dander: Secondary | ICD-10-CM | POA: Diagnosis not present

## 2023-11-05 DIAGNOSIS — J3089 Other allergic rhinitis: Secondary | ICD-10-CM | POA: Diagnosis not present

## 2023-11-07 DIAGNOSIS — J3089 Other allergic rhinitis: Secondary | ICD-10-CM | POA: Diagnosis not present

## 2023-11-07 DIAGNOSIS — J301 Allergic rhinitis due to pollen: Secondary | ICD-10-CM | POA: Diagnosis not present

## 2023-11-07 DIAGNOSIS — J3081 Allergic rhinitis due to animal (cat) (dog) hair and dander: Secondary | ICD-10-CM | POA: Diagnosis not present

## 2023-11-20 ENCOUNTER — Other Ambulatory Visit: Payer: Self-pay | Admitting: Internal Medicine

## 2023-11-20 ENCOUNTER — Other Ambulatory Visit: Payer: Self-pay

## 2023-11-20 ENCOUNTER — Other Ambulatory Visit (HOSPITAL_COMMUNITY): Payer: Self-pay

## 2023-11-20 MED ORDER — ARNUITY ELLIPTA 100 MCG/ACT IN AEPB
100.0000 ug | INHALATION_SPRAY | Freq: Every day | RESPIRATORY_TRACT | 3 refills | Status: DC
  Filled 2023-11-20: qty 30, 30d supply, fill #0
  Filled 2024-01-30: qty 30, 30d supply, fill #1

## 2023-11-21 ENCOUNTER — Other Ambulatory Visit: Payer: Self-pay

## 2023-11-21 ENCOUNTER — Other Ambulatory Visit: Payer: Self-pay | Admitting: Family

## 2023-11-21 ENCOUNTER — Telehealth: Payer: Self-pay

## 2023-11-21 ENCOUNTER — Other Ambulatory Visit (HOSPITAL_COMMUNITY): Payer: Self-pay

## 2023-11-21 DIAGNOSIS — J3089 Other allergic rhinitis: Secondary | ICD-10-CM | POA: Diagnosis not present

## 2023-11-21 DIAGNOSIS — Z23 Encounter for immunization: Secondary | ICD-10-CM

## 2023-11-21 DIAGNOSIS — J301 Allergic rhinitis due to pollen: Secondary | ICD-10-CM | POA: Diagnosis not present

## 2023-11-21 DIAGNOSIS — J3081 Allergic rhinitis due to animal (cat) (dog) hair and dander: Secondary | ICD-10-CM | POA: Diagnosis not present

## 2023-11-21 MED ORDER — COVID-19 MRNA VAC-TRIS(PFIZER) 30 MCG/0.3ML IM SUSY
0.3000 mL | PREFILLED_SYRINGE | Freq: Once | INTRAMUSCULAR | 0 refills | Status: AC
Start: 2023-11-21 — End: 2023-11-22
  Filled 2023-11-21: qty 0.3, 1d supply, fill #0

## 2023-11-21 NOTE — Telephone Encounter (Unsigned)
 Copied from CRM #[id number]. Topic: Clinical - Medication Refill >> Nov 21, 2023 10:43 AM Sherri Blanchard wrote: Medication: Covid sot   Has the patient contacted their pharmacy? Yes (Agent: If no, request that the patient contact the pharmacy for the refill. If patient does not wish to contact the pharmacy document the reason why and proceed with request.) (Agent: If yes, when and what did the pharmacy advise?)  This is the patient's preferred pharmacy:  [facility] [address] [place] [address] Phone: [phone] Fax: [phone]  Is this the correct pharmacy for this prescription? Yes If no, delete pharmacy and type the correct one.   Has the prescription been filled recently? No  Is the patient out of the medication? Yes  Has the patient been seen for an appointment in the last year OR does the patient have an upcoming appointment? Yes  Can we respond through MyChart? Yes  Agent: Please be advised that Rx refills may take up to 3 business days. We ask that you follow-up with your pharmacy.

## 2023-11-21 NOTE — Telephone Encounter (Signed)
 Copied from CRM [id number]. Topic: Appointments - Transfer of Care >> Nov 21, 2023 10:41 AM Roselie BROCKS wrote: Pt is requesting to transfer FROM: DR Jimmy Pt is requesting to transfer TO: Dr Corwin  Reason for requested transfer: Doctor leaving  It is the responsibility of the team the patient would like to transfer to (Dr. Dr corwin) to reach out to the patient if for any reason this transfer is not acceptable.

## 2023-11-24 NOTE — Telephone Encounter (Signed)
TOC ok with me

## 2023-11-24 NOTE — Telephone Encounter (Signed)
 pt schedule a appt for TOC

## 2023-12-05 ENCOUNTER — Other Ambulatory Visit (HOSPITAL_COMMUNITY): Payer: Self-pay

## 2023-12-05 MED ORDER — COVID-19 MRNA VAC-TRIS(PFIZER) 30 MCG/0.3ML IM SUSY
PREFILLED_SYRINGE | INTRAMUSCULAR | 0 refills | Status: DC
  Filled 2023-12-05: qty 0.3, 1d supply, fill #0

## 2023-12-05 MED ORDER — FLUZONE 0.5 ML IM SUSY
PREFILLED_SYRINGE | INTRAMUSCULAR | 0 refills | Status: DC
  Filled 2023-12-05: qty 0.5, 1d supply, fill #0

## 2023-12-12 ENCOUNTER — Other Ambulatory Visit (HOSPITAL_COMMUNITY): Payer: Self-pay

## 2024-01-30 ENCOUNTER — Other Ambulatory Visit (HOSPITAL_COMMUNITY): Payer: Self-pay

## 2024-02-12 ENCOUNTER — Encounter: Payer: Commercial Managed Care - PPO | Admitting: Family

## 2024-02-27 ENCOUNTER — Encounter: Payer: Self-pay | Admitting: Family

## 2024-02-27 ENCOUNTER — Ambulatory Visit: Payer: Self-pay | Admitting: Family

## 2024-02-27 ENCOUNTER — Ambulatory Visit: Admitting: Family

## 2024-02-27 ENCOUNTER — Encounter: Payer: Self-pay | Admitting: Pharmacist

## 2024-02-27 ENCOUNTER — Other Ambulatory Visit: Payer: Self-pay

## 2024-02-27 ENCOUNTER — Other Ambulatory Visit (HOSPITAL_COMMUNITY): Payer: Self-pay

## 2024-02-27 VITALS — BP 130/84 | HR 87 | Temp 98.3°F | Ht 61.75 in | Wt 222.4 lb

## 2024-02-27 DIAGNOSIS — R760 Raised antibody titer: Secondary | ICD-10-CM | POA: Diagnosis not present

## 2024-02-27 DIAGNOSIS — Z832 Family history of diseases of the blood and blood-forming organs and certain disorders involving the immune mechanism: Secondary | ICD-10-CM

## 2024-02-27 DIAGNOSIS — R7689 Other specified abnormal immunological findings in serum: Secondary | ICD-10-CM

## 2024-02-27 DIAGNOSIS — M2550 Pain in unspecified joint: Secondary | ICD-10-CM

## 2024-02-27 DIAGNOSIS — Z1211 Encounter for screening for malignant neoplasm of colon: Secondary | ICD-10-CM

## 2024-02-27 DIAGNOSIS — R252 Cramp and spasm: Secondary | ICD-10-CM

## 2024-02-27 DIAGNOSIS — R7303 Prediabetes: Secondary | ICD-10-CM | POA: Diagnosis not present

## 2024-02-27 DIAGNOSIS — J4520 Mild intermittent asthma, uncomplicated: Secondary | ICD-10-CM | POA: Diagnosis not present

## 2024-02-27 DIAGNOSIS — E049 Nontoxic goiter, unspecified: Secondary | ICD-10-CM

## 2024-02-27 DIAGNOSIS — B379 Candidiasis, unspecified: Secondary | ICD-10-CM

## 2024-02-27 DIAGNOSIS — R791 Abnormal coagulation profile: Secondary | ICD-10-CM

## 2024-02-27 DIAGNOSIS — R82998 Other abnormal findings in urine: Secondary | ICD-10-CM | POA: Diagnosis not present

## 2024-02-27 DIAGNOSIS — R311 Benign essential microscopic hematuria: Secondary | ICD-10-CM | POA: Diagnosis not present

## 2024-02-27 DIAGNOSIS — Z720 Tobacco use: Secondary | ICD-10-CM

## 2024-02-27 LAB — BASIC METABOLIC PANEL WITH GFR
BUN: 14 mg/dL (ref 6–23)
CO2: 31 meq/L (ref 19–32)
Calcium: 9.8 mg/dL (ref 8.4–10.5)
Chloride: 101 meq/L (ref 96–112)
Creatinine, Ser: 0.76 mg/dL (ref 0.40–1.20)
GFR: 94.67 mL/min
Glucose, Bld: 82 mg/dL (ref 70–99)
Potassium: 4.4 meq/L (ref 3.5–5.1)
Sodium: 139 meq/L (ref 135–145)

## 2024-02-27 LAB — POCT URINE DIPSTICK
Bilirubin, UA: NEGATIVE
Glucose, UA: NEGATIVE mg/dL
Ketones, POC UA: NEGATIVE mg/dL
Nitrite, UA: NEGATIVE
POC PROTEIN,UA: NEGATIVE
Spec Grav, UA: 1.02
Urobilinogen, UA: NEGATIVE U/dL — AB
pH, UA: 6

## 2024-02-27 LAB — URINALYSIS, ROUTINE W REFLEX MICROSCOPIC
Bilirubin Urine: NEGATIVE
Ketones, ur: NEGATIVE
Nitrite: NEGATIVE
Specific Gravity, Urine: 1.015 (ref 1.000–1.030)
Total Protein, Urine: NEGATIVE
Urine Glucose: NEGATIVE
Urobilinogen, UA: 0.2 (ref 0.0–1.0)
pH: 6 (ref 5.0–8.0)

## 2024-02-27 LAB — CK: Total CK: 121 U/L (ref 17–177)

## 2024-02-27 LAB — C-REACTIVE PROTEIN: CRP: 1.3 mg/dL (ref 1.0–20.0)

## 2024-02-27 LAB — TSH: TSH: 0.97 u[IU]/mL (ref 0.35–5.50)

## 2024-02-27 LAB — HEMOGLOBIN A1C: Hgb A1c MFr Bld: 5.6 % (ref 4.6–6.5)

## 2024-02-27 LAB — SEDIMENTATION RATE: Sed Rate: 17 mm/h (ref 0–20)

## 2024-02-27 MED ORDER — MONTELUKAST SODIUM 10 MG PO TABS
10.0000 mg | ORAL_TABLET | Freq: Every day | ORAL | 3 refills | Status: AC
  Filled 2024-02-27: qty 30, 30d supply, fill #0

## 2024-02-27 MED ORDER — FLUTICASONE-SALMETEROL 100-50 MCG/ACT IN AEPB
1.0000 | INHALATION_SPRAY | Freq: Two times a day (BID) | RESPIRATORY_TRACT | 3 refills | Status: AC
  Filled 2024-02-27: qty 60, 30d supply, fill #0

## 2024-02-27 NOTE — Progress Notes (Signed)
 "  Established Patient Office Visit  Subjective:      CC:  Chief Complaint  Patient presents with   Establish Care    Transfer of care from Dr. Jimmy    HPI: Sherri Blanchard is a 43 y.o. female presenting on 02/27/2024 for Establish Care (Transfer of care from Dr. Jimmy) .  Discussed the use of AI scribe software for clinical note transcription with the patient, who gave verbal consent to proceed.  Pcp Dr. Jimmy   History of Present Illness Sherri Blanchard is a 43 year old female with hypertension and asthma who presents for a follow-up visit.  She has a history of hypertension, managed with valsartan  and hydrochlorothiazide . She also takes Zyrtec daily for allergies and uses an EpiPen  as needed due to known allergies to cat's claw and shellfish, which cause hives if consumed in large amounts.  Her asthma is managed with Arnuity once daily, albuterol , and a nebulizer as needed. Her asthma symptoms have improved since starting Arnuity, with attacks occurring about once a month. She is concerned about the cost of Arnuity and mentions previous insurance coverage issues with Advair .  She has lupus anticoagulant, discovered during a fertility clinic visit. She has not been consistent with taking baby aspirin. There is a family history of blood clots, with her aunt experiencing similar issues.  She experiences sleep disturbances, attributed to perimenopause, and takes magnesium to aid sleep, which she finds effective. She also reports anxiety, with symptoms including heart racing and cold, tingling nose, occurring two to three times a day, attributed to perimenopause.  She has a history of IBS and reports frequent diarrhea, attributed to high magnesium intake and lack of a gallbladder. She also experiences leg cramps, managed with self-massage and hydration.  She reports intermittent hematuria and has not undergone urological evaluation. She has a history of a C-section and gallbladder  removal.      Lab Results  Component Value Date   TSH 0.97 02/27/2024        Social history:  Relevant past medical, surgical, family and social history reviewed and updated as indicated. Interim medical history since our last visit reviewed.  Allergies and medications reviewed and updated.  DATA REVIEWED: CHART IN EPIC     ROS: Negative unless specifically indicated above in HPI.   Current Medications[1]        Objective:        BP 130/84 (BP Location: Left Arm, Patient Position: Sitting, Cuff Size: Large)   Pulse 87   Temp 98.3 F (36.8 C) (Temporal)   Ht 5' 1.75 (1.568 m)   Wt 222 lb 6.4 oz (100.9 kg)   LMP 02/27/2024 (Approximate)   SpO2 98%   BMI 41.01 kg/m   Physical Exam   Wt Readings from Last 3 Encounters:  02/27/24 222 lb 6.4 oz (100.9 kg)  02/10/23 238 lb (108 kg)  07/29/22 238 lb (108 kg)    Physical Exam Constitutional:      General: She is not in acute distress.    Appearance: Normal appearance. She is normal weight. She is not ill-appearing.  HENT:     Head: Normocephalic.     Right Ear: Tympanic membrane normal.     Left Ear: Tympanic membrane normal.     Nose: Nose normal.     Mouth/Throat:     Mouth: Mucous membranes are moist.  Eyes:     Extraocular Movements: Extraocular movements intact.     Pupils: Pupils are equal, round, and  reactive to light.  Cardiovascular:     Rate and Rhythm: Normal rate and regular rhythm.  Pulmonary:     Effort: Pulmonary effort is normal.     Breath sounds: Normal breath sounds.  Musculoskeletal:        General: Normal range of motion.     Cervical back: Normal range of motion.  Skin:    General: Skin is warm.     Capillary Refill: Capillary refill takes less than 2 seconds.  Neurological:     General: No focal deficit present.     Mental Status: She is alert.  Psychiatric:        Mood and Affect: Mood normal.        Behavior: Behavior normal.        Thought Content: Thought  content normal.        Judgment: Judgment normal.          Results   Assessment & Plan:   Assessment and Plan Assessment & Plan Mild intermittent asthma Asthma is not well-controlled with current medication regimen. Experiences asthma attacks approximately once a month. Arnuity is used as maintenance therapy, but cost is a concern. Discussed alternative inhalers such as Advair , which may be covered by insurance. Emphasized the importance of maintenance therapy to prevent inflammation and asthma exacerbations. - Switched from Arnuity to Advair  if insurance covers it - Instructed to ensure mouth is washed after each use of inhaler - Instructed to monitor for changes in asthma symptoms and update if Advair  is still expensive  Lupus anticoagulant positivity Previously diagnosed with lupus anticoagulant positivity during fertility evaluation. No current symptoms of lupus, but family history suggests potential risk. Discussed the importance of aspirin therapy due to increased risk of thromboembolic events. - Ordered lupus anticoagulant panel to confirm current status - If positive, will recommend continuation of aspirin therapy  Polyarthralgia Reports daily joint pain and morning stiffness. Symptoms may be related to perimenopause or other underlying conditions. No specific diagnosis made during this visit. - Ordered lupus anticoagulant panel to rule out lupus-related arthritis  Muscle cramps Experiences muscle cramps, particularly in the calves, lasting up to four days. Symptoms may be related to electrolyte imbalance or dehydration. Discussed the importance of hydration and electrolyte balance. - Increase water  intake to 64-80 ounces daily - Consider daily electrolyte supplementation with Gatorade or similar products - Ordered creatinine kinase to assess for muscle damage  Benign essential microscopic hematuria Reports hematuria without urinary symptoms. Previous ultrasound showed a  small fibroid, but no urological evaluation has been done. Discussed the need for further evaluation if hematuria persists. - Performed urinalysis to assess current hematuria status - If moderate to large hematuria persists, will refer to urology for further evaluation  Sleep disorder Sleep issues have worsened, potentially related to perimenopause. Magnesium supplementation helps improve sleep quality. Discussed alternative sleep aids if needed. - Continue magnesium supplementation at 300-400 mg daily - Consider melatonin or sleep aids if sleep issues persist  Tobacco use Smokes cigarettes for the past ten years. Discussed the importance of smoking cessation and its health benefits. - Encouraged smoking cessation  General Health Maintenance Due for a colonoscopy as part of routine screening. Discussed the importance of scheduling the procedure. - Placed referral for colonoscopy with GI specialist        Return in about 1 year (around 02/26/2025) for f/u CPE.     Ginger Patrick, MSN, APRN, FNP-C [facility]        [  1]  Current Outpatient Medications:    albuterol  (PROVENTIL ) (2.5 MG/3ML) 0.083% nebulizer solution, Take 3 mLs (2.5 mg total) by nebulization every 6 (six) hours as needed for wheezing or shortness of breath., Disp: 60 mL, Rfl: 1   albuterol  (VENTOLIN  HFA) 108 (90 Base) MCG/ACT inhaler, Inhale 2 puffs into the lungs every 6 (six) hours as needed for wheezing or shortness of breath., Disp: 6.7 g, Rfl: 1   aspirin 81 MG chewable tablet, Chew 81 mg by mouth daily., Disp: , Rfl:    azelastine  (OPTIVAR ) 0.05 % ophthalmic solution, Instill 1 drop into the affected eye twice a day, Disp: 6 mL, Rfl: 3   cetirizine (ZYRTEC) 10 MG tablet, Take 10 mg by mouth daily., Disp: , Rfl: 11   EPINEPHrine  0.3 mg/0.3 mL IJ SOAJ injection, Inject as directed as needed, Disp: 2 each, Rfl: 1   fluticasone -salmeterol (ADVAIR ) 100-50 MCG/ACT AEPB, Inhale 1 puff  into the lungs 2 (two) times daily., Disp: 60 each, Rfl: 3   MAGNESIUM GLYCINATE PO, Take 300 mg by mouth at bedtime as needed., Disp: , Rfl:    Multiple Vitamin (MULTIVITAMIN) tablet, Take 1 tablet by mouth daily., Disp: , Rfl:    valsartan -hydrochlorothiazide  (DIOVAN -HCT) 160-12.5 MG tablet, Take 1 tablet by mouth daily., Disp: 90 tablet, Rfl: 3   montelukast  (SINGULAIR ) 10 MG tablet, Take 1 tablet (10 mg total) by mouth daily., Disp: 30 tablet, Rfl: 3  "

## 2024-02-27 NOTE — Progress Notes (Signed)
 noted

## 2024-02-28 LAB — URINE CULTURE
MICRO NUMBER:: 17379245
Result:: NO GROWTH
SPECIMEN QUALITY:: ADEQUATE

## 2024-02-28 LAB — LUPUS ANTICOAGULANT PANEL
Dilute Viper Venom Time: 48.1 s — ABNORMAL HIGH (ref 0.0–47.0)
PTT Lupus Anticoagulant: 31.3 s (ref 0.0–43.5)

## 2024-02-28 LAB — RHEUMATOID FACTOR: Rheumatoid fact SerPl-aCnc: <10 [IU]/mL

## 2024-02-28 LAB — DRVVT MIX: dRVVT Mix: 40.4 s (ref 0.0–40.4)

## 2024-03-01 ENCOUNTER — Encounter: Payer: Self-pay | Admitting: Family

## 2024-03-01 LAB — ENA+DNA/DS+ANTICH+CENTRO+FA...
Anti JO-1: <0.2 AI (ref 0.0–0.9)
Antiribosomal P Antibodies: <0.2 AI (ref 0.0–0.9)
Centromere Ab Screen: <0.2 AI (ref 0.0–0.9)
Chromatin Ab SerPl-aCnc: <0.2 AI (ref 0.0–0.9)
ENA RNP Ab: 0.2 AI (ref 0.0–0.9)
ENA SM Ab Ser-aCnc: <0.2 AI (ref 0.0–0.9)
ENA SSA (RO) Ab: <0.2 AI (ref 0.0–0.9)
ENA SSB (LA) Ab: <0.2 AI (ref 0.0–0.9)
Scleroderma (Scl-70) (ENA) Antibody, IgG: <0.2 AI (ref 0.0–0.9)
Smith/RNP Antibodies: <0.2 AI (ref 0.0–0.9)
Speckled Pattern: 1:320 {titer} — ABNORMAL HIGH
dsDNA Ab: <1 [IU]/mL (ref 0–9)

## 2024-03-01 LAB — ANA W/REFLEX: ANA Titer 1: POSITIVE — AB

## 2024-03-02 ENCOUNTER — Other Ambulatory Visit (HOSPITAL_COMMUNITY): Payer: Self-pay

## 2024-03-02 ENCOUNTER — Other Ambulatory Visit: Payer: Self-pay

## 2024-03-02 MED ORDER — FLUCONAZOLE 150 MG PO TABS
150.0000 mg | ORAL_TABLET | Freq: Once | ORAL | 0 refills | Status: AC
  Filled 2024-03-02: qty 1, 1d supply, fill #0

## 2024-03-08 ENCOUNTER — Encounter: Payer: Self-pay | Admitting: Gastroenterology

## 2024-03-10 ENCOUNTER — Telehealth: Admitting: Physician Assistant

## 2024-03-10 ENCOUNTER — Ambulatory Visit: Payer: Self-pay | Admitting: Family

## 2024-03-10 ENCOUNTER — Other Ambulatory Visit (HOSPITAL_COMMUNITY): Payer: Self-pay

## 2024-03-10 DIAGNOSIS — J208 Acute bronchitis due to other specified organisms: Secondary | ICD-10-CM

## 2024-03-10 MED ORDER — PREDNISONE 20 MG PO TABS
40.0000 mg | ORAL_TABLET | Freq: Every day | ORAL | 0 refills | Status: AC
  Filled 2024-03-10: qty 10, 5d supply, fill #0

## 2024-03-10 MED ORDER — BENZONATATE 100 MG PO CAPS
100.0000 mg | ORAL_CAPSULE | Freq: Three times a day (TID) | ORAL | 0 refills | Status: AC | PRN
  Filled 2024-03-10: qty 30, 10d supply, fill #0

## 2024-03-10 NOTE — Telephone Encounter (Signed)
 FYI Only or Action Required?: Action required by provider: request for appointment and medication refill request.  Patient was last seen in primary care on 02/27/2024 by Corwin Antu, FNP.  Called Nurse Triage reporting No chief complaint on file..  Symptoms began a week ago.  Interventions attempted: OTC medications: Mucinex, guaifenesin  and Prescription medications: Albuterol .  Symptoms are: unchanged.  Triage Disposition: See HCP Within 4 Hours (Or PCP Triage)  Patient/caregiver understands and will follow disposition?:   Reason for Disposition  Wheezing is present  Answer Assessment - Initial Assessment Questions Is using albuterol  inhaler, guaifenesin. Is requesting prednisone . No appointments available today. Can not come in for appointment. There are telehealth appointments available tomorrow but unable to schedule due to dispo. Patient requesting med be sent in or call to schedule telehealth appointment. 1. ONSET: When did the cough begin?      Saturday 3. SPUTUM: Describe the color of your sputum (e.g., none, dry cough; clear, white, yellow, green)     Yellow in the morning,  clear the rest of the day 4. HEMOPTYSIS: Are you coughing up any blood? If Yes, ask: How much? (e.g., flecks, streaks, tablespoons, etc.)     Denies 5. DIFFICULTY BREATHING: Are you having difficulty breathing? If Yes, ask: How bad is it? (e.g., mild, moderate, severe)      Can get difficult if she coughs a lot and can not catch her breath 6. FEVER: Do you have a fever? If Yes, ask: What is your temperature, how was it measured, and when did it start?     Denies 7. CARDIAC HISTORY: Do you have any history of heart disease? (e.g., heart attack, congestive heart failure)      HTN 8. LUNG HISTORY: Do you have any history of lung disease?  (e.g., pulmonary embolus, asthma, emphysema)     Asthma 9. PE RISK FACTORS: Do you have a history of blood clots? (or: recent major surgery,  recent prolonged travel, bedridden)     Denies 10. OTHER SYMPTOMS: Do you have any other symptoms? (e.g., runny nose, wheezing, chest pain)       Fatigue, wheezing 11. PREGNANCY: Is there any chance you are pregnant? When was your last menstrual period?       Denies 12. TRAVEL: Have you traveled out of the country in the last month? (e.g., travel history, exposures)       Denies  Protocols used: Cough - Acute Productive-A-AH

## 2024-03-10 NOTE — Telephone Encounter (Signed)
 Reviewed chart, she just completed a virtual visit and was prescribed prednisone  and tessalon perls.

## 2024-03-10 NOTE — Telephone Encounter (Signed)
 I spoke with pt pt wants prednisone . pt said starting on 03/06/24 a dry cough and feeling fatigued pt has asthma. Pt has no fever, no CP, and no SOB. Pt does not want to go to UC or ED ; Emmie at front desk is assisting pt to schedule telehealth visit.with UC & ED precautions and pt voiced understanding., sending note to Dr KANDICE as RICK.

## 2024-03-10 NOTE — Progress Notes (Signed)
 We are sorry that you are not feeling well.  Here is how we plan to help!  Based on your presentation I believe you most likely have A cough due to a virus.  This is called viral bronchitis and is best treated by rest, plenty of fluids and control of the cough.  You may use Ibuprofen or Tylenol  as directed to help your symptoms.     In addition you may use A prescription cough medication called Tessalon Perles 100mg . You may take 1-2 capsules every 8 hours as needed for your cough. I have also sent in a prednisone  burst to take as directed.  From your responses in the eVisit questionnaire you describe inflammation in the upper respiratory tract which is causing a significant cough.  This is commonly called Bronchitis and has four common causes:   Allergies Viral Infections Acid Reflux Bacterial Infection Allergies, viruses and acid reflux are treated by controlling symptoms or eliminating the cause. An example might be a cough caused by taking certain blood pressure medications. You stop the cough by changing the medication. Another example might be a cough caused by acid reflux. Controlling the reflux helps control the cough.  USE OF BRONCHODILATOR (RESCUE) INHALERS: There is a risk from using your bronchodilator too frequently.  The risk is that over-reliance on a medication which only relaxes the muscles surrounding the breathing tubes can reduce the effectiveness of medications prescribed to reduce swelling and congestion of the tubes themselves.  Although you feel brief relief from the bronchodilator inhaler, your asthma may actually be worsening with the tubes becoming more swollen and filled with mucus.  This can delay other crucial treatments, such as oral steroid medications. If you need to use a bronchodilator inhaler daily, several times per day, you should discuss this with your provider.  There are probably better treatments that could be used to keep your asthma under control.      HOME CARE Only take medications as instructed by your medical team. Complete the entire course of an antibiotic. Drink plenty of fluids and get plenty of rest. Avoid close contacts especially the very young and the elderly Cover your mouth if you cough or cough into your sleeve. Always remember to wash your hands A steam or ultrasonic humidifier can help congestion.   GET HELP RIGHT AWAY IF: You develop worsening fever. You become short of breath You cough up blood. Your symptoms persist after you have completed your treatment plan MAKE SURE YOU  Understand these instructions. Will watch your condition. Will get help right away if you are not doing well or get worse.  Your e-visit answers were reviewed by a board certified advanced clinical practitioner to complete your personal care plan.  Depending on the condition, your plan could have included both over the counter or prescription medications. If there is a problem please reply  once you have received a response from your provider. Your safety is important to us .  If you have drug allergies check your prescription carefully.    You can use MyChart to ask questions about todays visit, request a non-urgent call back, or ask for a work or school excuse for 24 hours related to this e-Visit. If it has been greater than 24 hours you will need to follow up with your provider, or enter a new e-Visit to address those concerns. You will get an e-mail in the next two days asking about your experience.  I hope that your e-visit has been valuable  and will speed your recovery. Thank you for using e-visits.   I have spent 5 minutes in review of e-visit questionnaire, review and updating patient chart, medical decision making and response to patient.   Elsie Velma Lunger, PA-C

## 2024-03-24 ENCOUNTER — Other Ambulatory Visit (INDEPENDENT_AMBULATORY_CARE_PROVIDER_SITE_OTHER)

## 2024-03-24 DIAGNOSIS — R791 Abnormal coagulation profile: Secondary | ICD-10-CM | POA: Diagnosis not present

## 2024-03-25 ENCOUNTER — Other Ambulatory Visit (HOSPITAL_COMMUNITY): Payer: Self-pay

## 2024-04-06 LAB — HYPERCOAGULABLE PANEL, COMPREHENSIVE
APTT: 25.9 s
AT III Act/Nor PPP Chro: 100 %
Act. Prt C Resist w/FV Defic.: 2.6 ratio
Anticardiolipin Ab, IgG: <10 [GPL'U]
Anticardiolipin Ab, IgM: <10 [MPL'U]
Beta-2 Glycoprotein I, IgA: <10 SAU
Beta-2 Glycoprotein I, IgG: <10 SGU
Beta-2 Glycoprotein I, IgM: <10 SMU
DRVVT Confirm Seconds: 37.7 s
DRVVT Ratio: 1.4 ratio — ABNORMAL HIGH
DRVVT Screen Seconds: 53.8 s — ABNORMAL HIGH
Factor VII Antigen**: 120 %
Factor VIII Activity: 136 %
Hexagonal Phospholipid Neutral: 1 s
Homocysteine: 7.3 umol/L
Prot C Ag Act/Nor PPP Imm: 90 %
Prot S Ag Act/Nor PPP Imm: 100 %
Protein C Ag/FVII Ag Ratio**: 0.8 ratio
Protein S Ag/FVII Ag Ratio**: 0.8 ratio

## 2024-04-08 ENCOUNTER — Ambulatory Visit: Payer: Self-pay | Admitting: Family

## 2024-04-08 DIAGNOSIS — Z832 Family history of diseases of the blood and blood-forming organs and certain disorders involving the immune mechanism: Secondary | ICD-10-CM

## 2024-04-19 ENCOUNTER — Encounter

## 2024-05-03 ENCOUNTER — Encounter: Admitting: Gastroenterology

## 2024-06-15 ENCOUNTER — Ambulatory Visit

## 2024-08-27 ENCOUNTER — Encounter: Admitting: Family
